# Patient Record
Sex: Female | Born: 1945
Health system: Southern US, Community
[De-identification: ages and names within clinical notes are randomized; demographics above are authoritative.]

## PROBLEM LIST (undated history)

## (undated) DIAGNOSIS — E669 Obesity, unspecified: Secondary | ICD-10-CM

## (undated) DIAGNOSIS — R0602 Shortness of breath: Principal | ICD-10-CM

## (undated) DIAGNOSIS — I2510 Atherosclerotic heart disease of native coronary artery without angina pectoris: Secondary | ICD-10-CM

## (undated) DIAGNOSIS — E785 Hyperlipidemia, unspecified: Secondary | ICD-10-CM

## (undated) DIAGNOSIS — M791 Myalgia, unspecified site: Secondary | ICD-10-CM

## (undated) DIAGNOSIS — E079 Disorder of thyroid, unspecified: Secondary | ICD-10-CM

## (undated) DIAGNOSIS — N809 Endometriosis, unspecified: Secondary | ICD-10-CM

## (undated) DIAGNOSIS — K91840 Postprocedural hemorrhage and hematoma of a digestive system organ or structure following a digestive system procedure: Secondary | ICD-10-CM

## (undated) DIAGNOSIS — G459 Transient cerebral ischemic attack, unspecified: Secondary | ICD-10-CM

## (undated) HISTORY — DX: Disorder of thyroid, unspecified: E07.9

## (undated) HISTORY — DX: Shortness of breath: R06.02

## (undated) HISTORY — DX: Postprocedural hemorrhage of a digestive system organ or structure following a digestive system procedure: K91.840

## (undated) HISTORY — DX: Obesity, unspecified: E66.9

## (undated) HISTORY — DX: Transient cerebral ischemic attack, unspecified: G45.9

## (undated) HISTORY — PX: CARDIAC CATHETERIZATION: SHX172

## (undated) HISTORY — DX: Hyperlipidemia, unspecified: E78.5

## (undated) HISTORY — DX: Myalgia, unspecified site: M79.10

## (undated) HISTORY — DX: Endometriosis, unspecified: N80.9

---

## 2000-07-12 ENCOUNTER — Other Ambulatory Visit: Admission: RE | Admit: 2000-07-12 | Discharge: 2000-07-12 | Payer: Self-pay | Admitting: Internal Medicine

## 2000-07-20 ENCOUNTER — Encounter: Admission: RE | Admit: 2000-07-20 | Discharge: 2000-10-18 | Payer: Self-pay | Admitting: Internal Medicine

## 2001-07-14 ENCOUNTER — Other Ambulatory Visit: Admission: RE | Admit: 2001-07-14 | Discharge: 2001-07-14 | Payer: Self-pay | Admitting: Internal Medicine

## 2002-09-27 ENCOUNTER — Encounter: Payer: Self-pay | Admitting: Ophthalmology

## 2002-09-27 ENCOUNTER — Ambulatory Visit (HOSPITAL_COMMUNITY): Admission: RE | Admit: 2002-09-27 | Discharge: 2002-09-28 | Payer: Self-pay | Admitting: Ophthalmology

## 2003-06-28 ENCOUNTER — Encounter: Payer: Self-pay | Admitting: Internal Medicine

## 2003-06-28 ENCOUNTER — Encounter: Admission: RE | Admit: 2003-06-28 | Discharge: 2003-06-28 | Payer: Self-pay | Admitting: Internal Medicine

## 2006-04-23 ENCOUNTER — Emergency Department (HOSPITAL_COMMUNITY): Admission: EM | Admit: 2006-04-23 | Discharge: 2006-04-23 | Payer: Self-pay | Admitting: Emergency Medicine

## 2008-05-07 ENCOUNTER — Ambulatory Visit (HOSPITAL_COMMUNITY): Admission: RE | Admit: 2008-05-07 | Discharge: 2008-05-07 | Payer: Self-pay | Admitting: Gastroenterology

## 2008-05-07 ENCOUNTER — Encounter (INDEPENDENT_AMBULATORY_CARE_PROVIDER_SITE_OTHER): Payer: Self-pay | Admitting: Gastroenterology

## 2008-10-06 ENCOUNTER — Ambulatory Visit: Payer: Self-pay | Admitting: Cardiology

## 2008-10-06 ENCOUNTER — Inpatient Hospital Stay (HOSPITAL_COMMUNITY): Admission: EM | Admit: 2008-10-06 | Discharge: 2008-10-08 | Payer: Self-pay | Admitting: Emergency Medicine

## 2008-10-08 ENCOUNTER — Encounter (INDEPENDENT_AMBULATORY_CARE_PROVIDER_SITE_OTHER): Payer: Self-pay | Admitting: Internal Medicine

## 2008-10-08 ENCOUNTER — Ambulatory Visit: Payer: Self-pay | Admitting: Vascular Surgery

## 2009-03-21 ENCOUNTER — Encounter: Admission: RE | Admit: 2009-03-21 | Discharge: 2009-03-21 | Payer: Self-pay | Admitting: Internal Medicine

## 2009-10-16 ENCOUNTER — Encounter: Admission: RE | Admit: 2009-10-16 | Discharge: 2009-10-16 | Payer: Self-pay | Admitting: Internal Medicine

## 2009-10-28 ENCOUNTER — Encounter: Admission: RE | Admit: 2009-10-28 | Discharge: 2009-10-28 | Payer: Self-pay | Admitting: Internal Medicine

## 2011-03-31 NOTE — H&P (Signed)
NAMELAURIANA, [name] NO.:  [id number]   MEDICAL RECORD NO.:  [id number]          PATIENT TYPE:  INP   LOCATION:  2014                         FACILITY:  [facility]   PHYSICIAN:  Joylene John, MD       DATE OF BIRTH:  1946/09/19   DATE OF ADMISSION:  10/06/2008  DATE OF DISCHARGE:                              HISTORY & PHYSICAL   REASON FOR ADMISSION:  Chest pain, which started earlier today.   HISTORY OF PRESENT ILLNESS:  This is a 65 year old white female with  history of Graves disease who came to the emergency room because she had  onset of left-sided chest pain, which started around 1 o'clock in the  afternoon today.  According to the patient, she has never had a similar  episode in the past.  She was getting dressed when the episode started.  The pain described by the patient was left sided, band like and pressure  like, going around to the back, associated with nausea.  According to  the patient, the pain was not aggravated or relieved by anything, and  the pain lasted approximately until the time she came to the ER.  The  patient denies any diaphoresis, dizziness, or lightheadedness that was  associated with the pain.  The patient does tell me that she has  returned from a long driving trip recently on Tuesday; however, she  tells me she did take several breaks.  She did not notice that there was  any lower extremity swelling after the trip.   PAST MEDICAL HISTORY:  Significant for Graves disease.   SOCIAL HISTORY:  The patient is a nonsmoker.  No drugs.  Social alcohol  drinker.   FAMILY HISTORY:  Father passed away from MI at age 28.  Sister with 6  stents, she is in her 31s.   ALLERGIES:  PENICILLIN, CODEINE, and DEMEROL.   CURRENT MEDICATIONS:  1. Aspirin 81 mg daily.  2. Synthroid 88 mcg a day.   PERTINENT LABORATORIES:  Hemoglobin of 12.7, crit 37.2, platelets 222,  and white count 5.1.  Sodium 135, potassium 3.8, chloride 102, bicarb  27, BUN  12, creatinine 0.87, glucose 89, and calcium 9.3.  First set of  cardiac enzymes is negative.  TSH is 0.422.  EKG was normal sinus rhythm  with no ST-T changes.  Chest x-ray does not show any acute pathology.   PHYSICAL EXAMINATION:  VITAL SIGNS:  Temperature of 98, pulse ranging  from 59 to 62, respirations 18, blood pressure 151/73.  GENERAL:  This is a white female who looks younger than her  chronological age.  NECK:  No JVD appreciated.  HEENT:  No icterus or pallor noted.  LUNGS:  Clear to auscultation.  CARDIOVASCULAR:  Regular rate and rhythm.  No murmurs were heard.  LOWER EXTREMITIES:  No edema is appreciated.   ASSESSMENT AND PLAN:  This is a 66 year old female coming in with new  onset of chest pain, which started this afternoon.  Plan is to admit the  patient to a telemetry bed and to do serial cardiac enzymes q.8  h., we  will also get an echocardiogram in the morning.  The patient can likely  be scheduled to have an outpatient stress test if her workup is negative  in the hospital and she remains hemodynamically stable.  We will  continue her aspirin and Synthroid.  We will do also start her on a low-  dose beta-blocker withhold parameters since the patient's heart rate is  in the 60s.  We will add a TSH level to make sure that she is not  hyperthyroid or overtly medicated.  We will also get a D-dimer and lower  extremity ultrasound to rule out DVT since the patient has history of  recent long driving trip.  If either of them are positive, then to  consider a CT of the chest to rule out PE since this could have caused  the chest pain episode that the patient describes as well.      Joylene John, MD  Electronically Signed     RP/MEDQ  D:  10/06/2008  T:  10/07/2008  Job:  [id number]

## 2011-03-31 NOTE — Op Note (Signed)
NAME:  [name], [name] NO.:  [id number]   MEDICAL RECORD NO.:  [id number]          PATIENT TYPE:  AMB   LOCATION:  ENDO                         FACILITY:  [facility]   PHYSICIAN:  Anselmo Rod, M.D.  DATE OF BIRTH:  October 18, 1946   DATE OF PROCEDURE:  05/07/2008  DATE OF DISCHARGE:                               OPERATIVE REPORT   PROCEDURE PERFORMED:  Colonoscopy with cold biopsies x 2.   ENDOSCOPIST:  Anselmo Rod, M.D.   INSTRUMENT USED:  Pentax video colonoscope.   INDICATIONS FOR PROCEDURE:  A 65 year old white female undergoing a  screening colonoscopy to rule out colonic polyps, masses, etc.   PREPROCEDURE PREPARATION:  Informed consent was procured from the  patient.  The patient fasted for the 4 hours prior to the procedure and  prepped with 20 OsmoPrep over night and 12 OsmoPrep the morning of  procedure.   The risks and benefits of the procedure, including a 10% missed rate of  cancer and polyp were discussed with the patient as well.   PREPROCEDURE PHYSICAL:  VITAL SIGNS:  The patient had stable vital  signs.  NECK:  Supple.  CHEST:  Clear to auscultation.  HEART:  S1-S2 regular.  ABDOMEN:  Soft with normal bowel sounds.   DESCRIPTION OF PROCEDURE:  The patient was placed in left lateral  decubitus position and sedated with 100 mcg of Fentanyl and 10 mg of  Versed given intravenously in slow incremental doses. Once the patient  was adequately sedated and maintained on low-flow oxygen and continuous  cardiac monitoring, the Pentax video colonoscope was advanced from the  rectum to the cecum with difficulty.  The patient had a very tortuous  colon.  The patient's position was changed from the left lateral to the  supine and the right lateral position back to the supine and the left  lateral position.  A small sessile polyp was removed from the hepatic  flexure (cold biopsies x2).  The rest of the colonic mucosa appeared  healthy with a normal  vascular pattern.  The appendiceal orifice and  ileocecal valve were clearly visualized and photographed.  The terminal  ileum appeared healthy without lesions.  Retroflexion revealed small  internal hemorrhoids.  Small external hemorrhoids were also noted on  anal inspection.  No other masses or polyps were identified.  There was  no evidence of diverticulosis.  No AVMs were present.  The patient  tolerated the procedure well without immediate complications.   IMPRESSION:  1. Small external and internal hemorrhoids seen on anal inspection and      retroflexion respectively.  2. Small sessile polyp removed by cold biopsies x2 from the hepatic      flexure.  3. Otherwise normal exam up to the terminal ileum.   RECOMMENDATIONS:  1. Await pathology results.  2. Continue high fiber diet with liberal fluid intake.  3. Outpatient follow-up as the need arises in the future.      Anselmo Rod, M.D.  Electronically Signed     JNM/MEDQ  D:  05/07/2008  T:  05/07/2008  Job:  [id number]   cc:   Candyce Churn. Allyne Gee, M.D.  Fax: [phone]

## 2011-03-31 NOTE — Discharge Summary (Signed)
[name], [name] NO.:  [id number]   MEDICAL RECORD NO.:  [id number]          PATIENT TYPE:  INP   LOCATION:  2014                         FACILITY:  [facility]   PHYSICIAN:  Theodosia Paling, MD    DATE OF BIRTH:  [birth year]   DATE OF ADMISSION:  10/06/2008  DATE OF DISCHARGE:  10/08/2008                               DISCHARGE SUMMARY   PRIMARY CARE PHYSICIAN:  Unassigned.   ADMITTING HISTORY:  Please refer to the admission note dictated by Dr.  [name] under history of present illness.   DISCHARGE DIAGNOSES:  1. Chest pain, most likely non-cardiac in nature.  2. Hypothyroidism.   DISCHARGE MEDICATIONS:  1. Aspirin 81 mg p.o. daily, enteric-coated.  2. Synthroid 88 mcg p.o. daily.   HOSPITAL COURSE:  Following issues were addressed during the  hospitalization:  1. Chest pain.  The patient underwent tele evaluation.  Her EKG was      negative.  Her cardiac enzymes were negative.  She did not have      again another episode of chest pain.  She was on aspirin and low-      dose beta-blocker.  She underwent a stress test next day and      echocardiogram which both of them were negative for any wall motion      or any signs of ischemia or wall motion defect respectively.  The      patient, at the time of discharge, was asymptomatic and      hemodynamically stable.  She will be going home and she was      instructed to continue low-dose aspirin, enteric-coated p.o. daily.  2. Hypothyroidism.  Synthroid 88 mcg p.o. daily was continued.   PROCEDURE PERFORMED:  Treadmill stress test.   CONSULTATION PERFORMED:  None.   IMAGING PERFORMED:  Echocardiogram showed normal LV size and function  with ejection fraction of 60% with subaortic ridge or membrane with some  subaortic turbulence but no significant stenosis.  CT scan, CT angio of  the chest done on October 07, 2008, showed it was negative for any PE.  A treadmill stress test done on October 08, 2008,  was negative for any  ischemic evidence on EKG or tele or reproduction of symptom.   DISPOSITION:  The patient will follow up with her primary care physician  in 1 week's time for control of her cholesterol.   DISCHARGE INSTRUCTIONS:  The patient is instructed to pursue healthy  diet and exercise regimen given her allergic reaction to statin.   TOTAL TIME SPENT IN DISCHARGE OF THIS PATIENT:  40 minutes.      Theodosia Paling, MD  Electronically Signed     NP/MEDQ  D:  10/08/2008  T:  10/09/2008  Job:  [id number]

## 2011-04-03 NOTE — Op Note (Signed)
NAME:  [name], [name]                         ACCOUNT NO.:  [id number]   MEDICAL RECORD NO.:  [id number]                   PATIENT TYPE:  OIB   LOCATION:  5729                                 FACILITY:  [facility]   PHYSICIAN:  Beulah Gandy. Ashley Royalty, M.D.              DATE OF BIRTH:  14-May-1946   DATE OF PROCEDURE:  09/28/2002  DATE OF DISCHARGE:                                 OPERATIVE REPORT   PREOPERATIVE DIAGNOSES:  1. Retinal breaks, right eye.  2. Rhegmatogenous retinal detachment, left eye.   PROCEDURES:  1. Laser for retinal breaks, right eye.  2. Scleral buckle with retinal photocoagulation, left eye.   SURGEON:  Beulah Gandy. Ashley Royalty, M.D.   ASSISTANT:  Lu Duffel, C.O.A., S.A.   ANESTHESIA:  General.   COMPLICATIONS:  None.   DURATION:  Two hours.   DESCRIPTION OF PROCEDURE:  Usual prep and drape.  After proper endotracheal  anesthesia, attention was carried to the right eye, where retinal breaks  were seen at 9 o'clock and 10 o'clock.  These were surrounded with indirect  ophthalmoscope laser, 402 burns.  The power was 400 milliwatts, 1000 microns  each, and 0.1 seconds each.  Ointment was placed in the eye, and the eye was  taped shut and covered with a shield.  Attention was then carried to the  left eye, where the usual prep and drape was performed for ophthalmic  surgery.  A 360 degree limbal peritomy, isolation of four rectus muscles on  2-0 silk, localization of break at 3 o'clock.  Scleral dissection from 12  o'clock to 6 o'clock to admit a #279 intrascleral implant.  Additional  posterior dissection was carried out at 3 o'clock to support a small 508G  wedge segment.  The diathermy was placed in the bed.  Two scleral flaps were  placed in each quadrant for a total of four scleral sutures.  The 240 band  was placed around the eye with a belt loop at 8 o'clock and 10 o'clock and a  270 sleeve at 10 o'clock.  The 279 implant was placed against the globe  except for  the 508G wedge at 3 o'clock.  A perforation site was chosen at 4  o'clock.  A large amount of clear, colorless subretinal fluid came forth.  A  second drainage site was performed at 3 o'clock in the posterior aspect of  the bed.  A moderate amount of clear, colorless subretinal fluid came forth  at this point.  The scleral flaps were closed.  Indirect ophthalmoscopy  showed the retina to be lying nicely on the scleral buckle with the retinal  break well-supported.  The indirect ophthalmoscope laser was moved into  place.  Three hundred seventy-seven burns were placed on the scleral buckle  around the retinal break.  Power was 400 milliwatts, 1000 microns each, and  0.1 second each.  The retina was lying nicely on  the scleral buckle with  minimal subretinal fluid remaining.  The scleral flaps were adjusted and  trimmed.  The scleral sutures were knotted, adjusted, and trimmed.  The  scleral buckle was adjusted and trimmed.  The 240 band was adjusted and  trimmed.  The conjunctiva was reposited with 7-0 chromic suture.  Polymyxin  and gentamicin were irrigated into Tenon's space.  Atropine solution was  applied.  The closing tension was 10 with a  Barraquer tonometer.  Decadron 10 mg was injected into the lower  subconjunctival space.  Marcaine was injected around the globe for postop  pain.  Polysporin, a patch and shield were placed.  The patient was awakened  and taken to recovery in satisfactory condition.                                                Beulah Gandy. Ashley Royalty, M.D.    JDM/MEDQ  D:  09/27/2002  T:  09/28/2002  Job:  [id number]

## 2011-07-24 ENCOUNTER — Other Ambulatory Visit: Payer: Self-pay | Admitting: Internal Medicine

## 2011-07-24 DIAGNOSIS — R1013 Epigastric pain: Secondary | ICD-10-CM

## 2011-07-24 DIAGNOSIS — R5381 Other malaise: Secondary | ICD-10-CM

## 2011-07-27 ENCOUNTER — Ambulatory Visit
Admission: RE | Admit: 2011-07-27 | Discharge: 2011-07-27 | Disposition: A | Discharge status: Home / Self Care | Payer: Medicare Other | Source: Ambulatory Visit | Attending: Internal Medicine | Admitting: Internal Medicine

## 2011-07-27 DIAGNOSIS — R5381 Other malaise: Secondary | ICD-10-CM

## 2011-07-27 DIAGNOSIS — R1013 Epigastric pain: Secondary | ICD-10-CM

## 2011-08-10 ENCOUNTER — Encounter (INDEPENDENT_AMBULATORY_CARE_PROVIDER_SITE_OTHER): Payer: Medicare Other | Admitting: Ophthalmology

## 2011-08-10 DIAGNOSIS — H35379 Puckering of macula, unspecified eye: Secondary | ICD-10-CM

## 2011-08-18 LAB — CBC
HCT: 35 — ABNORMAL LOW
HCT: 35.7 — ABNORMAL LOW
HCT: 37.2
Hemoglobin: 11.9 — ABNORMAL LOW
MCHC: 33.7
MCHC: 34.2
MCHC: 34.2
MCV: 92.2
Platelets: 199
Platelets: 222
RBC: 3.87
RDW: 13
RDW: 13.3
RDW: 13.5
WBC: 5
WBC: 5.1

## 2011-08-18 LAB — BASIC METABOLIC PANEL
BUN: 12
Calcium: 9.5
Chloride: 102
GFR calc Af Amer: >60
GFR calc non Af Amer: 56 — ABNORMAL LOW
GFR calc non Af Amer: >60
Glucose, Bld: 107 — ABNORMAL HIGH
Glucose, Bld: 89

## 2011-08-18 LAB — CARDIAC PANEL(CRET KIN+CKTOT+MB+TROPI)
CK, MB: 0.5
Total CK: 27
Troponin I: 0.01

## 2011-08-18 LAB — COMPREHENSIVE METABOLIC PANEL
Alkaline Phosphatase: 86
Calcium: 9.4
Chloride: 105
Creatinine, Ser: 1
GFR calc Af Amer: >60
GFR calc non Af Amer: 56 — ABNORMAL LOW
Glucose, Bld: 104 — ABNORMAL HIGH
Potassium: 3.6
Sodium: 141
Total Bilirubin: 0.6

## 2011-08-18 LAB — CK TOTAL AND CKMB (NOT AT ARMC)
CK, MB: 0.5
Relative Index: INVALID

## 2011-08-18 LAB — POCT CARDIAC MARKERS
CKMB, poc: <1 — ABNORMAL LOW
Myoglobin, poc: 76.2
Troponin i, poc: <0.05

## 2011-08-18 LAB — LIPID PANEL
Cholesterol: 302 — ABNORMAL HIGH
HDL: 54
Total CHOL/HDL Ratio: 5.6
VLDL: 32

## 2011-08-18 LAB — DIFFERENTIAL
Basophils Relative: 1
Lymphocytes Relative: 32
Monocytes Absolute: 0.3
Neutro Abs: 3

## 2011-08-18 LAB — TROPONIN I: Troponin I: <0.01

## 2012-02-26 DIAGNOSIS — M7730 Calcaneal spur, unspecified foot: Secondary | ICD-10-CM | POA: Diagnosis not present

## 2012-02-26 DIAGNOSIS — L600 Ingrowing nail: Secondary | ICD-10-CM | POA: Diagnosis not present

## 2012-08-10 ENCOUNTER — Encounter (INDEPENDENT_AMBULATORY_CARE_PROVIDER_SITE_OTHER): Payer: Medicare Other | Admitting: Ophthalmology

## 2012-08-25 DIAGNOSIS — M25579 Pain in unspecified ankle and joints of unspecified foot: Secondary | ICD-10-CM | POA: Diagnosis not present

## 2012-09-01 DIAGNOSIS — M25579 Pain in unspecified ankle and joints of unspecified foot: Secondary | ICD-10-CM | POA: Diagnosis not present

## 2012-09-13 DIAGNOSIS — M25579 Pain in unspecified ankle and joints of unspecified foot: Secondary | ICD-10-CM | POA: Diagnosis not present

## 2012-09-17 DIAGNOSIS — Z23 Encounter for immunization: Secondary | ICD-10-CM | POA: Diagnosis not present

## 2012-09-19 DIAGNOSIS — H9110 Presbycusis, unspecified ear: Secondary | ICD-10-CM | POA: Diagnosis not present

## 2012-09-19 DIAGNOSIS — H9319 Tinnitus, unspecified ear: Secondary | ICD-10-CM | POA: Diagnosis not present

## 2012-10-17 DIAGNOSIS — M545 Low back pain: Secondary | ICD-10-CM | POA: Diagnosis not present

## 2012-10-17 DIAGNOSIS — M4120 Other idiopathic scoliosis, site unspecified: Secondary | ICD-10-CM | POA: Diagnosis not present

## 2012-10-31 DIAGNOSIS — M545 Low back pain: Secondary | ICD-10-CM | POA: Diagnosis not present

## 2013-02-10 DIAGNOSIS — E039 Hypothyroidism, unspecified: Secondary | ICD-10-CM | POA: Diagnosis not present

## 2013-02-10 DIAGNOSIS — R011 Cardiac murmur, unspecified: Secondary | ICD-10-CM | POA: Diagnosis not present

## 2013-02-10 DIAGNOSIS — E785 Hyperlipidemia, unspecified: Secondary | ICD-10-CM | POA: Diagnosis not present

## 2013-02-10 DIAGNOSIS — Z0000 Encounter for general adult medical examination without abnormal findings: Secondary | ICD-10-CM | POA: Diagnosis not present

## 2013-02-10 DIAGNOSIS — Z79899 Other long term (current) drug therapy: Secondary | ICD-10-CM | POA: Diagnosis not present

## 2013-02-10 DIAGNOSIS — F329 Major depressive disorder, single episode, unspecified: Secondary | ICD-10-CM | POA: Diagnosis not present

## 2013-03-02 DIAGNOSIS — R011 Cardiac murmur, unspecified: Secondary | ICD-10-CM | POA: Diagnosis not present

## 2013-07-28 DIAGNOSIS — R209 Unspecified disturbances of skin sensation: Secondary | ICD-10-CM | POA: Diagnosis not present

## 2013-07-28 DIAGNOSIS — Z1239 Encounter for other screening for malignant neoplasm of breast: Secondary | ICD-10-CM | POA: Diagnosis not present

## 2013-07-28 DIAGNOSIS — E785 Hyperlipidemia, unspecified: Secondary | ICD-10-CM | POA: Diagnosis not present

## 2013-07-28 DIAGNOSIS — M2550 Pain in unspecified joint: Secondary | ICD-10-CM | POA: Diagnosis not present

## 2013-07-28 DIAGNOSIS — E039 Hypothyroidism, unspecified: Secondary | ICD-10-CM | POA: Diagnosis not present

## 2013-07-28 DIAGNOSIS — Z1331 Encounter for screening for depression: Secondary | ICD-10-CM | POA: Diagnosis not present

## 2013-09-20 DIAGNOSIS — Z23 Encounter for immunization: Secondary | ICD-10-CM | POA: Diagnosis not present

## 2014-03-07 DIAGNOSIS — N760 Acute vaginitis: Secondary | ICD-10-CM | POA: Diagnosis not present

## 2014-03-07 DIAGNOSIS — R350 Frequency of micturition: Secondary | ICD-10-CM | POA: Diagnosis not present

## 2014-04-25 DIAGNOSIS — M5106 Intervertebral disc disorders with myelopathy, lumbar region: Secondary | ICD-10-CM | POA: Diagnosis not present

## 2014-04-25 DIAGNOSIS — M999 Biomechanical lesion, unspecified: Secondary | ICD-10-CM | POA: Diagnosis not present

## 2014-04-25 DIAGNOSIS — M5430 Sciatica, unspecified side: Secondary | ICD-10-CM | POA: Diagnosis not present

## 2014-04-25 DIAGNOSIS — M5137 Other intervertebral disc degeneration, lumbosacral region: Secondary | ICD-10-CM | POA: Diagnosis not present

## 2014-04-27 DIAGNOSIS — M5137 Other intervertebral disc degeneration, lumbosacral region: Secondary | ICD-10-CM | POA: Diagnosis not present

## 2014-04-27 DIAGNOSIS — M5106 Intervertebral disc disorders with myelopathy, lumbar region: Secondary | ICD-10-CM | POA: Diagnosis not present

## 2014-04-27 DIAGNOSIS — M5430 Sciatica, unspecified side: Secondary | ICD-10-CM | POA: Diagnosis not present

## 2014-04-27 DIAGNOSIS — M999 Biomechanical lesion, unspecified: Secondary | ICD-10-CM | POA: Diagnosis not present

## 2014-04-28 DIAGNOSIS — M5106 Intervertebral disc disorders with myelopathy, lumbar region: Secondary | ICD-10-CM | POA: Diagnosis not present

## 2014-04-28 DIAGNOSIS — M5430 Sciatica, unspecified side: Secondary | ICD-10-CM | POA: Diagnosis not present

## 2014-04-28 DIAGNOSIS — M5137 Other intervertebral disc degeneration, lumbosacral region: Secondary | ICD-10-CM | POA: Diagnosis not present

## 2014-04-28 DIAGNOSIS — M999 Biomechanical lesion, unspecified: Secondary | ICD-10-CM | POA: Diagnosis not present

## 2014-04-30 DIAGNOSIS — M5137 Other intervertebral disc degeneration, lumbosacral region: Secondary | ICD-10-CM | POA: Diagnosis not present

## 2014-04-30 DIAGNOSIS — M5106 Intervertebral disc disorders with myelopathy, lumbar region: Secondary | ICD-10-CM | POA: Diagnosis not present

## 2014-04-30 DIAGNOSIS — M5430 Sciatica, unspecified side: Secondary | ICD-10-CM | POA: Diagnosis not present

## 2014-04-30 DIAGNOSIS — M999 Biomechanical lesion, unspecified: Secondary | ICD-10-CM | POA: Diagnosis not present

## 2014-06-06 DIAGNOSIS — M5106 Intervertebral disc disorders with myelopathy, lumbar region: Secondary | ICD-10-CM | POA: Diagnosis not present

## 2014-06-06 DIAGNOSIS — M5430 Sciatica, unspecified side: Secondary | ICD-10-CM | POA: Diagnosis not present

## 2014-06-06 DIAGNOSIS — M5137 Other intervertebral disc degeneration, lumbosacral region: Secondary | ICD-10-CM | POA: Diagnosis not present

## 2014-06-06 DIAGNOSIS — M999 Biomechanical lesion, unspecified: Secondary | ICD-10-CM | POA: Diagnosis not present

## 2014-06-07 DIAGNOSIS — M5106 Intervertebral disc disorders with myelopathy, lumbar region: Secondary | ICD-10-CM | POA: Diagnosis not present

## 2014-06-07 DIAGNOSIS — M999 Biomechanical lesion, unspecified: Secondary | ICD-10-CM | POA: Diagnosis not present

## 2014-06-07 DIAGNOSIS — M5430 Sciatica, unspecified side: Secondary | ICD-10-CM | POA: Diagnosis not present

## 2014-06-07 DIAGNOSIS — M5137 Other intervertebral disc degeneration, lumbosacral region: Secondary | ICD-10-CM | POA: Diagnosis not present

## 2014-06-08 DIAGNOSIS — M5430 Sciatica, unspecified side: Secondary | ICD-10-CM | POA: Diagnosis not present

## 2014-06-08 DIAGNOSIS — M5106 Intervertebral disc disorders with myelopathy, lumbar region: Secondary | ICD-10-CM | POA: Diagnosis not present

## 2014-06-08 DIAGNOSIS — M5137 Other intervertebral disc degeneration, lumbosacral region: Secondary | ICD-10-CM | POA: Diagnosis not present

## 2014-06-08 DIAGNOSIS — M999 Biomechanical lesion, unspecified: Secondary | ICD-10-CM | POA: Diagnosis not present

## 2014-06-09 DIAGNOSIS — M5106 Intervertebral disc disorders with myelopathy, lumbar region: Secondary | ICD-10-CM | POA: Diagnosis not present

## 2014-06-09 DIAGNOSIS — M999 Biomechanical lesion, unspecified: Secondary | ICD-10-CM | POA: Diagnosis not present

## 2014-06-09 DIAGNOSIS — M5137 Other intervertebral disc degeneration, lumbosacral region: Secondary | ICD-10-CM | POA: Diagnosis not present

## 2014-06-09 DIAGNOSIS — M5430 Sciatica, unspecified side: Secondary | ICD-10-CM | POA: Diagnosis not present

## 2014-07-27 ENCOUNTER — Encounter: Payer: Self-pay | Admitting: *Deleted

## 2014-10-18 ENCOUNTER — Other Ambulatory Visit (HOSPITAL_COMMUNITY): Payer: Self-pay | Admitting: Internal Medicine

## 2014-10-18 DIAGNOSIS — Z1231 Encounter for screening mammogram for malignant neoplasm of breast: Secondary | ICD-10-CM

## 2014-10-24 ENCOUNTER — Other Ambulatory Visit (HOSPITAL_COMMUNITY): Payer: Self-pay | Admitting: Internal Medicine

## 2014-10-24 ENCOUNTER — Ambulatory Visit (HOSPITAL_COMMUNITY)
Admission: RE | Admit: 2014-10-24 | Discharge: 2014-10-24 | Disposition: A | Discharge status: Home / Self Care | Payer: Medicare Other | Source: Ambulatory Visit | Attending: Internal Medicine | Admitting: Internal Medicine

## 2014-10-24 DIAGNOSIS — Z1231 Encounter for screening mammogram for malignant neoplasm of breast: Secondary | ICD-10-CM | POA: Diagnosis not present

## 2014-11-27 DIAGNOSIS — J069 Acute upper respiratory infection, unspecified: Secondary | ICD-10-CM | POA: Diagnosis not present

## 2014-11-27 DIAGNOSIS — J020 Streptococcal pharyngitis: Secondary | ICD-10-CM | POA: Diagnosis not present

## 2015-01-01 DIAGNOSIS — M545 Low back pain: Secondary | ICD-10-CM | POA: Diagnosis not present

## 2015-01-01 DIAGNOSIS — M4727 Other spondylosis with radiculopathy, lumbosacral region: Secondary | ICD-10-CM | POA: Diagnosis not present

## 2015-01-08 DIAGNOSIS — M4302 Spondylolysis, cervical region: Secondary | ICD-10-CM | POA: Diagnosis not present

## 2015-03-05 DIAGNOSIS — E785 Hyperlipidemia, unspecified: Secondary | ICD-10-CM | POA: Diagnosis not present

## 2015-03-05 DIAGNOSIS — M545 Low back pain: Secondary | ICD-10-CM | POA: Diagnosis not present

## 2015-03-05 DIAGNOSIS — R350 Frequency of micturition: Secondary | ICD-10-CM | POA: Diagnosis not present

## 2015-03-05 DIAGNOSIS — Z1389 Encounter for screening for other disorder: Secondary | ICD-10-CM | POA: Diagnosis not present

## 2015-03-05 DIAGNOSIS — E039 Hypothyroidism, unspecified: Secondary | ICD-10-CM | POA: Diagnosis not present

## 2015-03-14 DIAGNOSIS — M419 Scoliosis, unspecified: Secondary | ICD-10-CM | POA: Diagnosis not present

## 2015-03-14 DIAGNOSIS — M5416 Radiculopathy, lumbar region: Secondary | ICD-10-CM | POA: Diagnosis not present

## 2015-03-20 ENCOUNTER — Other Ambulatory Visit: Payer: Self-pay | Admitting: Physical Medicine and Rehabilitation

## 2015-03-20 DIAGNOSIS — M545 Low back pain: Secondary | ICD-10-CM

## 2015-03-25 ENCOUNTER — Other Ambulatory Visit: Payer: Self-pay | Admitting: Physical Medicine and Rehabilitation

## 2015-03-25 DIAGNOSIS — Z139 Encounter for screening, unspecified: Secondary | ICD-10-CM

## 2015-03-29 ENCOUNTER — Ambulatory Visit
Admission: RE | Admit: 2015-03-29 | Discharge: 2015-03-29 | Disposition: A | Discharge status: Home / Self Care | Payer: Medicare Other | Source: Ambulatory Visit | Attending: Physical Medicine and Rehabilitation | Admitting: Physical Medicine and Rehabilitation

## 2015-03-29 DIAGNOSIS — Z135 Encounter for screening for eye and ear disorders: Secondary | ICD-10-CM | POA: Diagnosis not present

## 2015-03-29 DIAGNOSIS — Z139 Encounter for screening, unspecified: Secondary | ICD-10-CM

## 2015-04-09 ENCOUNTER — Other Ambulatory Visit: Payer: Medicare Other

## 2015-04-09 ENCOUNTER — Ambulatory Visit
Admission: RE | Admit: 2015-04-09 | Discharge: 2015-04-09 | Disposition: A | Discharge status: Home / Self Care | Payer: Medicare Other | Source: Ambulatory Visit | Attending: Physical Medicine and Rehabilitation | Admitting: Physical Medicine and Rehabilitation

## 2015-04-09 DIAGNOSIS — M47816 Spondylosis without myelopathy or radiculopathy, lumbar region: Secondary | ICD-10-CM | POA: Diagnosis not present

## 2015-04-09 DIAGNOSIS — M545 Low back pain: Secondary | ICD-10-CM

## 2015-04-09 DIAGNOSIS — M4806 Spinal stenosis, lumbar region: Secondary | ICD-10-CM | POA: Diagnosis not present

## 2015-04-18 DIAGNOSIS — M5416 Radiculopathy, lumbar region: Secondary | ICD-10-CM | POA: Diagnosis not present

## 2015-04-18 DIAGNOSIS — M545 Low back pain: Secondary | ICD-10-CM | POA: Diagnosis not present

## 2015-04-18 DIAGNOSIS — M4806 Spinal stenosis, lumbar region: Secondary | ICD-10-CM | POA: Diagnosis not present

## 2015-05-01 DIAGNOSIS — E559 Vitamin D deficiency, unspecified: Secondary | ICD-10-CM | POA: Diagnosis not present

## 2015-05-01 DIAGNOSIS — M5137 Other intervertebral disc degeneration, lumbosacral region: Secondary | ICD-10-CM | POA: Diagnosis not present

## 2015-05-01 DIAGNOSIS — M546 Pain in thoracic spine: Secondary | ICD-10-CM | POA: Diagnosis not present

## 2015-05-01 DIAGNOSIS — M542 Cervicalgia: Secondary | ICD-10-CM | POA: Diagnosis not present

## 2015-05-01 DIAGNOSIS — M2550 Pain in unspecified joint: Secondary | ICD-10-CM | POA: Diagnosis not present

## 2015-05-01 DIAGNOSIS — M25571 Pain in right ankle and joints of right foot: Secondary | ICD-10-CM | POA: Diagnosis not present

## 2015-05-01 DIAGNOSIS — M25572 Pain in left ankle and joints of left foot: Secondary | ICD-10-CM | POA: Diagnosis not present

## 2015-05-01 DIAGNOSIS — Z79899 Other long term (current) drug therapy: Secondary | ICD-10-CM | POA: Diagnosis not present

## 2015-08-17 ENCOUNTER — Emergency Department (EMERGENCY_DEPARTMENT_HOSPITAL): Admit: 2015-08-17 | Discharge: 2015-08-17 | Disposition: A | Discharge status: Home / Self Care | Payer: Medicare Other

## 2015-08-17 ENCOUNTER — Encounter (HOSPITAL_COMMUNITY): Payer: Self-pay | Admitting: Emergency Medicine

## 2015-08-17 ENCOUNTER — Emergency Department (HOSPITAL_COMMUNITY)
Admission: EM | Admit: 2015-08-17 | Discharge: 2015-08-17 | Disposition: A | Discharge status: Home / Self Care | Payer: Medicare Other | Attending: Emergency Medicine | Admitting: Emergency Medicine

## 2015-08-17 ENCOUNTER — Emergency Department (HOSPITAL_COMMUNITY): Payer: Medicare Other

## 2015-08-17 DIAGNOSIS — Y9301 Activity, walking, marching and hiking: Secondary | ICD-10-CM | POA: Diagnosis not present

## 2015-08-17 DIAGNOSIS — X58XXXA Exposure to other specified factors, initial encounter: Secondary | ICD-10-CM | POA: Insufficient documentation

## 2015-08-17 DIAGNOSIS — Z79899 Other long term (current) drug therapy: Secondary | ICD-10-CM | POA: Diagnosis not present

## 2015-08-17 DIAGNOSIS — Z8742 Personal history of other diseases of the female genital tract: Secondary | ICD-10-CM | POA: Insufficient documentation

## 2015-08-17 DIAGNOSIS — M79605 Pain in left leg: Secondary | ICD-10-CM | POA: Diagnosis not present

## 2015-08-17 DIAGNOSIS — Y9289 Other specified places as the place of occurrence of the external cause: Secondary | ICD-10-CM | POA: Diagnosis not present

## 2015-08-17 DIAGNOSIS — E785 Hyperlipidemia, unspecified: Secondary | ICD-10-CM | POA: Diagnosis not present

## 2015-08-17 DIAGNOSIS — Y998 Other external cause status: Secondary | ICD-10-CM | POA: Diagnosis not present

## 2015-08-17 DIAGNOSIS — Z87891 Personal history of nicotine dependence: Secondary | ICD-10-CM | POA: Insufficient documentation

## 2015-08-17 DIAGNOSIS — M79609 Pain in unspecified limb: Secondary | ICD-10-CM

## 2015-08-17 DIAGNOSIS — S8992XA Unspecified injury of left lower leg, initial encounter: Secondary | ICD-10-CM | POA: Insufficient documentation

## 2015-08-17 MED ORDER — KETOROLAC TROMETHAMINE 60 MG/2ML IM SOLN
30.0000 mg | Freq: Once | INTRAMUSCULAR | Status: AC
  Administered 2015-08-17: 30 mg via INTRAMUSCULAR
  Filled 2015-08-17: qty 2

## 2015-08-17 MED ORDER — MELOXICAM 7.5 MG PO TABS: 7.5000 mg | ORAL_TABLET | Freq: Every day | ORAL | Status: DC

## 2015-08-17 MED ORDER — ACETAMINOPHEN 500 MG PO TABS
1000.0000 mg | ORAL_TABLET | Freq: Once | ORAL | Status: DC
Start: 2015-08-17 — End: 2015-08-17

## 2015-08-17 NOTE — Progress Notes (Signed)
VASCULAR LAB PRELIMINARY  PRELIMINARY  PRELIMINARY  PRELIMINARY  Left lower extremity venous duplex completed.    Preliminary report:  There is no DVT, SVT, or Baker's cyst in the left lower extremity.   Needham Biggins, RVT 08/17/2015, 3:39 PM

## 2015-08-17 NOTE — ED Provider Notes (Signed)
CSN: [id number]     Arrival date & time 08/17/15  1349 History   First MD Initiated Contact with Patient 08/17/15 1355     Chief Complaint  Patient presents with  . Extremity Pain     (Consider location/radiation/quality/duration/timing/severity/associated sxs/prior Treatment) The history is provided by the patient and the spouse.   Pt has had pain in the posterior left leg for the past 2-3 weeks with intermittent swelling.  The pain is pulling and aching.  She has been using the leg gingerly but today was walking down the stairs and felt a pop in her left posterior thigh, has had increased pain since then.  Denies fevers, CP, SOB, weakness or numbness of the leg.  She has hx spinal stenosis that is unchanged.  Has had several long car trips recently to and from [place], the last was 1 month ago.  Has also been walking up and down a lot of stairs cleaning out her brother's house in [place].    Past Medical History  Diagnosis Date  . Hyperlipidemia   . Colonoscopy causing post-procedural bleeding   . Endometriosis    Past Surgical History  Procedure Laterality Date  . Cardiac catheterization     Family History  Problem Relation Age of Onset  . Family history unknown: Yes   Social History  Substance Use Topics  . Smoking status: Former Smoker -- 1.00 packs/day  . Smokeless tobacco: None  . Alcohol Use: Yes   OB History    No data available     Review of Systems  Constitutional: Negative for fever and chills.  Respiratory: Negative for shortness of breath.   Cardiovascular: Negative for chest pain.  Allergic/Immunologic: Negative for immunocompromised state.  All other systems reviewed and are negative.     Allergies  Codeine; Demerol; and Penicillins  Home Medications   Prior to Admission medications   Medication Sig Start Date End Date Taking? Authorizing Provider  Biotin 1 MG CAPS Take 1 capsule by mouth daily.   Yes Historical Provider, MD   levothyroxine (SYNTHROID, LEVOTHROID) 88 MCG tablet Take 88 mcg by mouth daily before breakfast.   Yes Historical Provider, MD  naproxen sodium (ANAPROX) 220 MG tablet Take 440 mg by mouth 2 (two) times daily as needed (pain).   Yes Historical Provider, MD  sertraline (ZOLOFT) 25 MG tablet Take 25 mg by mouth daily.   Yes Historical Provider, MD   BP 154/86 mmHg  Pulse 64  Temp(Src) 98.2 F (36.8 C) (Oral)  Resp 14  SpO2 96% Physical Exam  Constitutional: She appears well-developed and well-nourished. No distress.  HENT:  Head: Normocephalic and atraumatic.  Neck: Neck supple.  Cardiovascular: Normal rate, regular rhythm and intact distal pulses.   Pulmonary/Chest: Effort normal and breath sounds normal. No respiratory distress. She has no wheezes. She has no rales.  Musculoskeletal:  Diffuse tenderness throughout left posterior leg including calf, posterior knee, and posterior thigh.  No erythema.  Distal pulses intact.  Sensation intact.    Tenderness over lumbar spine.  No crepitus, stepoffs. No other bony tenderness of spine.   Neurological: She is alert.  Skin: She is not diaphoretic.  Nursing note and vitals reviewed.   ED Course  Procedures (including critical care time) Labs Review Labs Reviewed - No data to display  Imaging Review Dg Knee Complete 4 Views Left  08/17/2015   CLINICAL DATA:  Left leg pain  EXAM: LEFT KNEE - COMPLETE 4+ VIEW  COMPARISON:  None.  FINDINGS: There is no evidence of fracture, dislocation, or joint effusion. There is no evidence of arthropathy or other focal bone abnormality. Soft tissues are unremarkable.  IMPRESSION: Negative.   Electronically Signed   By: Franchot Gallo M.D.   On: 08/17/2015 15:11   I have personally reviewed and evaluated these images and lab results as part of my medical decision-making.   EKG Interpretation None       2:27 PM Discussed pt with Dr Colin Rhein who will also see the patient.    3:38 PM Duplex negative.     MDM   Final diagnoses:  Left leg pain    Afebrile, nontoxic patient with left leg pain x 2-3 weeks with pop felt in posterior thigh while walking down steps today with increased pain.  Neurovascularly intact.  Xray knee negative.  Venous duplex US negative.  Dr Colin Rhein also saw and examined patient.  Recommends knee immobilizer and orthopedic follow up.  Pt updated and agrees with plan.  D/C home with mobic.  Discussed result, findings, treatment, and follow up  with patient.  Pt given return precautions.  Pt verbalizes understanding and agrees with plan.       Clayton Bibles, PA-C 08/17/15 [place], MD 08/20/15 [phone]

## 2015-08-17 NOTE — ED Notes (Signed)
Patient complains of a sharp left leg pain to the posterior upper leg.  Patient states she was walking down steps and felt a pop, after which the pain began.

## 2015-08-17 NOTE — Discharge Instructions (Signed)
Read the information below.  Use the prescribed medication as directed.  Please discuss all new medications with your pharmacist.  You may return to the Emergency Department at any time for worsening condition or any new symptoms that concern you.   If you develop uncontrolled pain, weakness or numbness of the extremity, severe discoloration of the skin, or you are unable to walk or move your leg, return to the ER for a recheck.      Pain of Unknown Etiology (Pain Without a Known Cause) You have come to your caregiver because of pain. Pain can occur in any part of the body. Often there is not a definite cause. If your laboratory (blood or urine) work was normal and X-rays or other studies were normal, your caregiver may treat you without knowing the cause of the pain. An example of this is the headache. Most headaches are diagnosed by taking a history. This means your caregiver asks you questions about your headaches. Your caregiver determines a treatment based on your answers. Usually testing done for headaches is normal. Often testing is not done unless there is no response to medications. Regardless of where your pain is located today, you can be given medications to make you comfortable. If no physical cause of pain can be found, most cases of pain will gradually leave as suddenly as they came.  If you have a painful condition and no reason can be found for the pain, it is important that you follow up with your caregiver. If the pain becomes worse or does not go away, it may be necessary to repeat tests and look further for a possible cause.  Only take over-the-counter or prescription medicines for pain, discomfort, or fever as directed by your caregiver.  For the protection of your privacy, test results cannot be given over the phone. Make sure you receive the results of your test. Ask how these results are to be obtained if you have not been informed. It is your responsibility to obtain your test  results.  You may continue all activities unless the activities cause more pain. When the pain lessens, it is important to gradually resume normal activities. Resume activities by beginning slowly and gradually increasing the intensity and duration of the activities or exercise. During periods of severe pain, bed rest may be helpful. Lie or sit in any position that is comfortable.  Ice used for acute (sudden) conditions may be effective. Use a large plastic bag filled with ice and wrapped in a towel. This may provide pain relief.  See your caregiver for continued problems. Your caregiver can help or refer you for exercises or physical therapy if necessary. If you were given medications for your condition, do not drive, operate machinery or power tools, or sign legal documents for 24 hours. Do not drink alcohol, take sleeping pills, or take other medications that may interfere with treatment. See your caregiver immediately if you have pain that is becoming worse and not relieved by medications. Document Released: 07/28/2001 Document Revised: 08/23/2013 Document Reviewed: 11/02/2005 [facility] Patient Information 2015 [place], [place]. This information is not intended to replace advice given to you by your health care provider. Make sure you discuss any questions you have with your health care provider.  Musculoskeletal Pain Musculoskeletal pain is muscle and boney aches and pains. These pains can occur in any part of the body. Your caregiver may treat you without knowing the cause of the pain. They may treat you if blood or urine  tests, X-rays, and other tests were normal.  CAUSES There is often not a definite cause or reason for these pains. These pains may be caused by a type of germ (virus). The discomfort may also come from overuse. Overuse includes working out too hard when your body is not fit. Boney aches also come from weather changes. Bone is sensitive to atmospheric pressure changes. HOME CARE  INSTRUCTIONS   Ask when your test results will be ready. Make sure you get your test results.  Only take over-the-counter or prescription medicines for pain, discomfort, or fever as directed by your caregiver. If you were given medications for your condition, do not drive, operate machinery or power tools, or sign legal documents for 24 hours. Do not drink alcohol. Do not take sleeping pills or other medications that may interfere with treatment.  Continue all activities unless the activities cause more pain. When the pain lessens, slowly resume normal activities. Gradually increase the intensity and duration of the activities or exercise.  During periods of severe pain, bed rest may be helpful. Lay or sit in any position that is comfortable.  Putting ice on the injured area.  Put ice in a bag.  Place a towel between your skin and the bag.  Leave the ice on for 15 to 20 minutes, 3 to 4 times a day.  Follow up with your caregiver for continued problems and no reason can be found for the pain. If the pain becomes worse or does not go away, it may be necessary to repeat tests or do additional testing. Your caregiver may need to look further for a possible cause. SEEK IMMEDIATE MEDICAL CARE IF:  You have pain that is getting worse and is not relieved by medications.  You develop chest pain that is associated with shortness or breath, sweating, feeling sick to your stomach (nauseous), or throw up (vomit).  Your pain becomes localized to the abdomen.  You develop any new symptoms that seem different or that concern you. MAKE SURE YOU:   Understand these instructions.  Will watch your condition.  Will get help right away if you are not doing well or get worse. Document Released: 11/02/2005 Document Revised: 01/25/2012 Document Reviewed: 07/07/2013 [facility] Patient Information 2015 [place], [place]. This information is not intended to replace advice given to you by your health care  provider. Make sure you discuss any questions you have with your health care provider.

## 2016-01-20 DIAGNOSIS — R6889 Other general symptoms and signs: Secondary | ICD-10-CM | POA: Diagnosis not present

## 2016-02-26 DIAGNOSIS — L814 Other melanin hyperpigmentation: Secondary | ICD-10-CM | POA: Diagnosis not present

## 2016-02-26 DIAGNOSIS — L720 Epidermal cyst: Secondary | ICD-10-CM | POA: Diagnosis not present

## 2016-02-26 DIAGNOSIS — D225 Melanocytic nevi of trunk: Secondary | ICD-10-CM | POA: Diagnosis not present

## 2016-02-26 DIAGNOSIS — L821 Other seborrheic keratosis: Secondary | ICD-10-CM | POA: Diagnosis not present

## 2016-02-26 DIAGNOSIS — D2262 Melanocytic nevi of left upper limb, including shoulder: Secondary | ICD-10-CM | POA: Diagnosis not present

## 2016-06-03 DIAGNOSIS — Z119 Encounter for screening for infectious and parasitic diseases, unspecified: Secondary | ICD-10-CM | POA: Diagnosis not present

## 2016-06-03 DIAGNOSIS — E785 Hyperlipidemia, unspecified: Secondary | ICD-10-CM | POA: Diagnosis not present

## 2016-06-03 DIAGNOSIS — E559 Vitamin D deficiency, unspecified: Secondary | ICD-10-CM | POA: Diagnosis not present

## 2016-06-03 DIAGNOSIS — E039 Hypothyroidism, unspecified: Secondary | ICD-10-CM | POA: Diagnosis not present

## 2016-06-23 DIAGNOSIS — F4321 Adjustment disorder with depressed mood: Secondary | ICD-10-CM | POA: Diagnosis not present

## 2016-06-23 DIAGNOSIS — Z634 Disappearance and death of family member: Secondary | ICD-10-CM | POA: Diagnosis not present

## 2016-08-05 DIAGNOSIS — E7800 Pure hypercholesterolemia, unspecified: Secondary | ICD-10-CM | POA: Diagnosis not present

## 2016-08-05 DIAGNOSIS — F4321 Adjustment disorder with depressed mood: Secondary | ICD-10-CM | POA: Diagnosis not present

## 2016-08-05 DIAGNOSIS — Z23 Encounter for immunization: Secondary | ICD-10-CM | POA: Diagnosis not present

## 2016-08-19 ENCOUNTER — Other Ambulatory Visit: Payer: Self-pay | Admitting: Internal Medicine

## 2016-08-19 DIAGNOSIS — F4321 Adjustment disorder with depressed mood: Secondary | ICD-10-CM | POA: Diagnosis not present

## 2016-08-19 DIAGNOSIS — Z0000 Encounter for general adult medical examination without abnormal findings: Secondary | ICD-10-CM | POA: Diagnosis not present

## 2016-08-19 DIAGNOSIS — Z1231 Encounter for screening mammogram for malignant neoplasm of breast: Secondary | ICD-10-CM

## 2016-08-21 ENCOUNTER — Telehealth: Payer: Self-pay | Admitting: Cardiovascular Disease

## 2016-08-21 NOTE — Telephone Encounter (Signed)
Received notes from Dr. Emi Belfast for office visit with Dr., Oval Linsey on 08/25/2016 8:00am. Notes given to [facility] (medical records) [facility]

## 2016-08-24 NOTE — Progress Notes (Signed)
Cardiology Office Note   Date:  08/25/2016   ID:  [name], DOB [birth year], MRN [id number]  PCP:  Kelton Pillar, MD  Cardiologist:   Skeet Latch, MD   No chief complaint on file.    History of Present Illness: [name] is a 70 y.o. female with hyperlipidemia, Graves disease, and depression who presents for management of hyperlipidemia.  [name] reports several years of hyperlipidemia. She has previously tried atorvastatin and rosuvastatin but developed chest pain and shortness of breath with these medications. During that time she underwent cardiac catheterization in 2011 that reportedly showed mild CAD. She was also noted to have a murmur in 2014 and had an echo that was reportedly normal. [name] has been doing well. Her only complaint is fatigue. She falls asleep at 8 in the evening and awakened still feeling tired in the morning. Her husband notes that she does not snore and has no apneic episodes. She denies any shortness of breath, chest pain, lower extremity edema, orthopnea, or PND. She does not exercise regularly which she reports is due to laziness. She notes that she has an excellent diet.  [name] reported her history of statin intolerance to Dr. Emi Belfast and was referred to cardiology for evaluation.    [name] has several family members with CAD. Her father died of a heart attack at age 74 but she remembers him using angina tablets for years before then. Her sister has multiple stents, the first of which was implanted in her late 68s. Her mother also had coronary disease in her 52s.   Past Medical History:  Diagnosis Date  . Colonoscopy causing post-procedural bleeding   . Endometriosis   . Hyperlipidemia   . Obesity 08/25/2016    Past Surgical History:  Procedure Laterality Date  . CARDIAC CATHETERIZATION       Current Outpatient Prescriptions  Medication Sig Dispense Refill  . levothyroxine (SYNTHROID,  LEVOTHROID) 88 MCG tablet Take 88 mcg by mouth daily before breakfast.    . sertraline (ZOLOFT) 25 MG tablet Take 25 mg by mouth daily.    . pravastatin (PRAVACHOL) 10 MG tablet TAKE 1/2 TABLET BY MOUTH ON Monday, Wednesday, AND Friday ONLY OR AS DIRECTED BY LIPID CLINIC 15 tablet 5   No current facility-administered medications for this visit.     Allergies:   Codeine; Demerol [meperidine]; and Penicillins    Social History:  The patient  reports that she has quit smoking. She smoked 1.00 pack per day. She does not have any smokeless tobacco history on file. She reports that she drinks alcohol. She reports that she does not use drugs.   Family History:  The patient's  family history includes CAD in her father, mother, and sister; Dementia in her brother and mother; Kidney disease in her brother; Lymphoma in her sister.    ROS:  Please see the history of present illness.   Otherwise, review of systems are positive for tingling in arms and legs.   All other systems are reviewed and negative.    PHYSICAL EXAM: VS:  BP (!) 142/78   Pulse 60   Ht 5' (1.524 m)   Wt 175 lb 3.2 oz (79.5 kg)   BMI 34.22 kg/m  , BMI Body mass index is 34.22 kg/m. GENERAL:  Well appearing HEENT:  Pupils equal round and reactive, fundi not visualized, oral mucosa unremarkable NECK:  No jugular venous distention, waveform within normal limits, carotid upstroke brisk and  symmetric, no bruits, no thyromegaly LYMPHATICS:  No cervical adenopathy LUNGS:  Clear to auscultation bilaterally HEART:  RRR.  PMI not displaced or sustained,S1 and S2 within normal limits, no S3, no S4, no clicks, no rubs, no murmurs ABD:  Flat, positive bowel sounds normal in frequency in pitch, no bruits, no rebound, no guarding, no midline pulsatile mass, no hepatomegaly, no splenomegaly EXT:  2 plus pulses throughout, no edema, no cyanosis no clubbing SKIN:  No rashes no nodules NEURO:  Cranial nerves II through XII grossly intact,  motor grossly intact throughout PSYCH:  Cognitively intact, oriented to person place and time   EKG:  EKG is ordered today. The ekg ordered today demonstrates sinus rhythm. Rate 60 bpm. Nonspecific T wave abnormalities.   Recent Labs: No results found for requested labs within last 8760 hours.    Lipid Panel    Component Value Date/Time   CHOL (H) 10/08/2008 0500    302        ATP III CLASSIFICATION:  <200     mg/dL   Desirable  200-239  mg/dL   Borderline High  >=240    mg/dL   High   TRIG 158 (H) 10/08/2008 0500   HDL 54 10/08/2008 0500   CHOLHDL 5.6 10/08/2008 0500   VLDL 32 10/08/2008 0500   LDLCALC (H) 10/08/2008 0500    216        Total Cholesterol/HDL:CHD Risk Coronary Heart Disease Risk Table                     Men   Women  1/2 Average Risk   3.4   3.3    06/03/16:  WBC 5.2, hemoglobin of 0.4, hematocrit 37.4, platelets 222 Sodium 140, potassium 4.3 BUN 20, creatinine 0.96 AST 24, ALT 22 TSH 0.37 Total cholesterol 341, triglycerides 334, HDL 58, LDL 217  Wt Readings from Last 3 Encounters:  08/25/16 175 lb 3.2 oz (79.5 kg)      ASSESSMENT AND PLAN:  # Hyperlipidemia: Ms. Fassler has possible familial hyperlipidemia with the [place] lipid clinic score of 4 (LDL 217 and FH premature CAD).  She does not have any physical exam findings such as tendon xanthomas or arcus corenalis.  She was previously revealed rosuvastatin and atorvastatin. We will try pravastatin 5 mg 3 times per week, which is unlikely to get her to goal. Given her report of some coronary disease on cardiac catheterization she may qualify for PCSK9 inhibitor.  We will get her to check her lipids again before following up with our lipid clinic in one month. I have also encouraged her to exercise at least 30-45 minutes most days of the week.  I think this will also help with her energy levels    Current medicines are reviewed at length with the patient today.  The patient does not have concerns  regarding medicines.  The following changes have been made:  Pravastatin 5 mg MWF.  Labs/ tests ordered today include:   Orders Placed This Encounter  Procedures  . Lipid panel  . Comprehensive metabolic panel  . EKG 12-Lead     Disposition:   FU with Kabella Cassidy C. Oval Linsey, MD, [facility] in 3 months.  Lipid clinic in 1 month.     This note was written with the assistance of speech recognition software.  Please excuse any transcriptional errors.  Signed, Ashtin Melichar C. Oval Linsey, MD, [facility]  08/25/2016 8:50 AM    [facility]

## 2016-08-25 ENCOUNTER — Ambulatory Visit (INDEPENDENT_AMBULATORY_CARE_PROVIDER_SITE_OTHER): Payer: Medicare Other | Admitting: Cardiovascular Disease

## 2016-08-25 ENCOUNTER — Encounter: Payer: Self-pay | Admitting: Cardiovascular Disease

## 2016-08-25 VITALS — BP 142/78 | HR 60 | Ht 60.0 in | Wt 175.2 lb

## 2016-08-25 DIAGNOSIS — E785 Hyperlipidemia, unspecified: Secondary | ICD-10-CM | POA: Diagnosis not present

## 2016-08-25 DIAGNOSIS — E7800 Pure hypercholesterolemia, unspecified: Secondary | ICD-10-CM | POA: Diagnosis not present

## 2016-08-25 DIAGNOSIS — E66811 Obesity, class 1: Secondary | ICD-10-CM

## 2016-08-25 DIAGNOSIS — E6609 Other obesity due to excess calories: Secondary | ICD-10-CM | POA: Diagnosis not present

## 2016-08-25 DIAGNOSIS — E669 Obesity, unspecified: Secondary | ICD-10-CM

## 2016-08-25 HISTORY — DX: Obesity, unspecified: E66.9

## 2016-08-25 MED ORDER — PRAVASTATIN SODIUM 10 MG PO TABS: ORAL_TABLET | ORAL | 5 refills | Status: DC

## 2016-08-25 NOTE — Patient Instructions (Signed)
Medication Instructions:  START PRAVASTATIN 5 MG ON Monday, Wednesday, AND Friday ONLY  Labwork: FASTING LIPID/CMET FEW DAYS PRIOR TO LIPID CLINIC FOLLOW UP [facility] DOWNSTAIRS ON FIRST FLOOR  Testing/Procedures: NONE  Follow-Up: Your physician recommends that you schedule a follow-up appointment in: [place]  Your physician recommends that you schedule a follow-up appointment in: ABOUT 4 WEEKS IN [place]  If you need a refill on your cardiac medications before your next appointment, please call your pharmacy.

## 2016-09-02 ENCOUNTER — Other Ambulatory Visit: Payer: Self-pay | Admitting: Internal Medicine

## 2016-09-02 ENCOUNTER — Other Ambulatory Visit (HOSPITAL_COMMUNITY)
Admission: RE | Admit: 2016-09-02 | Discharge: 2016-09-02 | Disposition: A | Discharge status: Home / Self Care | Payer: Medicare Other | Source: Ambulatory Visit | Attending: Internal Medicine | Admitting: Internal Medicine

## 2016-09-02 DIAGNOSIS — D126 Benign neoplasm of colon, unspecified: Secondary | ICD-10-CM | POA: Diagnosis not present

## 2016-09-02 DIAGNOSIS — Z1151 Encounter for screening for human papillomavirus (HPV): Secondary | ICD-10-CM | POA: Diagnosis not present

## 2016-09-02 DIAGNOSIS — Z01419 Encounter for gynecological examination (general) (routine) without abnormal findings: Secondary | ICD-10-CM | POA: Diagnosis not present

## 2016-09-02 DIAGNOSIS — N941 Unspecified dyspareunia: Secondary | ICD-10-CM | POA: Diagnosis not present

## 2016-09-02 DIAGNOSIS — E7800 Pure hypercholesterolemia, unspecified: Secondary | ICD-10-CM | POA: Diagnosis not present

## 2016-09-04 LAB — CYTOLOGY - PAP
DIAGNOSIS: NEGATIVE
HPV (WINDOPATH): NOT DETECTED

## 2016-09-22 DIAGNOSIS — E785 Hyperlipidemia, unspecified: Secondary | ICD-10-CM | POA: Diagnosis not present

## 2016-09-23 ENCOUNTER — Ambulatory Visit
Admission: RE | Admit: 2016-09-23 | Discharge: 2016-09-23 | Disposition: A | Discharge status: Home / Self Care | Payer: Medicare Other | Source: Ambulatory Visit | Attending: Internal Medicine | Admitting: Internal Medicine

## 2016-09-23 DIAGNOSIS — Z1231 Encounter for screening mammogram for malignant neoplasm of breast: Secondary | ICD-10-CM

## 2016-09-23 DIAGNOSIS — M8588 Other specified disorders of bone density and structure, other site: Secondary | ICD-10-CM | POA: Diagnosis not present

## 2016-09-23 LAB — COMPREHENSIVE METABOLIC PANEL WITH GFR
ALT: 18 U/L (ref 6–29)
AST: 22 U/L (ref 10–35)
Albumin: 3.9 g/dL (ref 3.6–5.1)
Alkaline Phosphatase: 80 U/L (ref 33–130)
BUN: 18 mg/dL (ref 7–25)
CO2: 28 mmol/L (ref 20–31)
Calcium: 9.8 mg/dL (ref 8.6–10.4)
Chloride: 106 mmol/L (ref 98–110)
Creat: 1.01 mg/dL — ABNORMAL HIGH (ref 0.60–0.93)
Glucose, Bld: 92 mg/dL (ref 65–99)
Potassium: 4.6 mmol/L (ref 3.5–5.3)
Sodium: 142 mmol/L (ref 135–146)
Total Bilirubin: 0.6 mg/dL (ref 0.2–1.2)
Total Protein: 6.7 g/dL (ref 6.1–8.1)

## 2016-09-23 LAB — LIPID PANEL
CHOL/HDL RATIO: 4.7 ratio (ref <5.0)
Cholesterol: 259 mg/dL — ABNORMAL HIGH (ref <200)
HDL: 55 mg/dL (ref >50)
LDL Cholesterol: 169 mg/dL — ABNORMAL HIGH
TRIGLYCERIDES: 177 mg/dL — AB (ref <150)
VLDL: 35 mg/dL — ABNORMAL HIGH (ref <30)

## 2016-09-24 ENCOUNTER — Ambulatory Visit: Payer: Medicare Other

## 2016-10-16 DIAGNOSIS — M79641 Pain in right hand: Secondary | ICD-10-CM | POA: Diagnosis not present

## 2016-10-16 DIAGNOSIS — M25511 Pain in right shoulder: Secondary | ICD-10-CM | POA: Diagnosis not present

## 2016-10-21 ENCOUNTER — Ambulatory Visit (INDEPENDENT_AMBULATORY_CARE_PROVIDER_SITE_OTHER): Payer: Medicare Other | Admitting: Pharmacist Clinician (PhC)/ Clinical Pharmacy Specialist

## 2016-10-21 DIAGNOSIS — E785 Hyperlipidemia, unspecified: Secondary | ICD-10-CM | POA: Diagnosis not present

## 2016-10-21 NOTE — Assessment & Plan Note (Signed)
Patient with no clinical ASCVD but elevated cholesterol with untreated LDL at 217.  She started pravastatin 5mg  tiw about 2 months ago and appears to be tolerating without concern.  Adamant about not adding any other medications at this time.  Gave information about ezetimibe and Repatha, but she would like to continue with dietary changes and pravastatin. She was encouraged to start some simple exercises and look into Silver Sneakers at her local [employer].   Will have her repeat labs in February and then see her after that for follow up

## 2016-10-21 NOTE — Progress Notes (Signed)
10/21/2016 [name] 1946-05-31 [id number]   HPI:  [name] is a 70 y.o. female patient of Dr Oval Linsey, who presents today for a lipid clinic evaluation.  She was originally referred by her PCP (at [facility]) due to hyperlipidemia with an total cholesterol > 300 and LDL > 200.  She has tried both atorvastatin and rosuvastatin in the past, and both caused her to have myalgias, chest pain and flushing, within a week of starting each medication.  When she saw Dr. Oval Linsey she was started on pravastatin 5 mg three times weekly and today she admits feeling well, much to her own surprise.  She is very adamant about not starting any more medications, has recently started a diet from a book called "eat Skinny", and would prefer to wait and see how well the pravastatin works before making any changes.    Current Medications:  Pravastatin 5 mg each MWF  Risk Factors:  No ASVCD, [place] Lipid score of 4 (LDL 216 plus father died from CAD at 36)  Cholesterol Goals:  LDL < 100   Intolerant/previously tried:  Atorvastatin and rosuvastatin;  Doses unknown, took several years ago thru PCP at [place] history:   Father died from CAD at age 26; some family history of heart disease, but most family members live into their 3's.  Mother still living now  Diet:   Started eating based on book "Eat Skinny"; apparently eliminates many white carbs, encourages [place], chicken or fish, fresh/frozen fruits/vegetables.    Believes she has lost about 7 pounds to date.    Exercise:    Does not like to exercise; has some spinal stenosis and is followed at [facility].  Encouraged her to [facility], as they usually accommodate patients mobility issues.     Labs:  09-2016   TC 259  TG 177, HDL 35, LDL 169 (pravastatin 5 mg tiw x 3 weeks) 05-2016   TC 341  TG 334, HDL 58, LDL 217   Current Outpatient Prescriptions  Medication Sig Dispense Refill  . levothyroxine (SYNTHROID,  LEVOTHROID) 88 MCG tablet Take 88 mcg by mouth daily before breakfast.    . pravastatin (PRAVACHOL) 10 MG tablet TAKE 1/2 TABLET BY MOUTH ON Monday, Wednesday, AND Friday ONLY OR AS DIRECTED BY LIPID CLINIC 15 tablet 5  . sertraline (ZOLOFT) 25 MG tablet Take 25 mg by mouth daily.     No current facility-administered medications for this visit.     Allergies  Allergen Reactions  . Codeine   . Demerol [Meperidine]   . Penicillins Hives    Past Medical History:  Diagnosis Date  . Colonoscopy causing post-procedural bleeding   . Endometriosis   . Hyperlipidemia   . Obesity 08/25/2016  . Thyroid disease     There were no vitals taken for this visit.   Hyperlipidemia Patient with no clinical ASCVD but elevated cholesterol with untreated LDL at 217.  She started pravastatin 5mg  tiw about 2 months ago and appears to be tolerating without concern.  Adamant about not adding any other medications at this time.  Gave information about ezetimibe and Repatha, but she would like to continue with dietary changes and pravastatin. She was encouraged to start some simple exercises and look into Silver S[employer]akers at her local [employer].   Will have her repeat labs in February and then see her after that for follow up   Tommy Medal PharmD CPP [place]

## 2016-10-21 NOTE — Patient Instructions (Signed)
Continue with your dietary changes We would encourage you to do simple, chair exercises as able  Will repeat cholesterol and liver function labs in February, then make a decision about medications.   Cholesterol Cholesterol is a fat. Your body needs a small amount of cholesterol. Cholesterol (plaque) may build up in your blood vessels (arteries). That makes you more likely to have a heart attack or stroke. You cannot feel your cholesterol level. Having a blood test is the only way to find out if your level is high. Keep your test results. Work with your doctor to keep your cholesterol at a good level. What do the results mean?  Total cholesterol is how much cholesterol is in your blood.  LDL is bad cholesterol. This is the type that can build up. Try to have low LDL.  HDL is good cholesterol. It cleans your blood vessels and carries LDL away. Try to have high HDL.  Triglycerides are fat that the body can store or burn for energy. What are good levels of cholesterol?  Total cholesterol below 200.  LDL below 100 is good for people who have health risks. LDL below 70 is good for people who have very high risks.  HDL above 40 is good. It is best to have HDL of 60 or higher.  Triglycerides below 150. How can I lower my cholesterol? Diet  Follow your diet program as told by your doctor.  Choose fish, white meat chicken, or [place] that is roasted or baked. Try not to eat red meat, fried foods, sausage, or lunch meats.  Eat lots of fresh fruits and vegetables.  Choose whole grains, beans, pasta, potatoes, and cereals.  Choose olive oil, corn oil, or canola oil. Only use small amounts.  Try not to eat butter, mayonnaise, shortening, or palm kernel oils.  Try not to eat foods with trans fats.  Choose low-fat or nonfat dairy foods.  Drink skim or nonfat milk.  Eat low-fat or nonfat yogurt and cheeses.  Try not to drink whole milk or cream.  Try not to eat ice cream, egg yolks,  or full-fat cheeses.  Healthy desserts include angel food cake, ginger snaps, animal crackers, hard candy, popsicles, and low-fat or nonfat frozen yogurt. Try not to eat pastries, cakes, pies, and cookies. Exercise  Follow your exercise program as told by your doctor.  Be more active. Try gardening, walking, and taking the stairs.  Ask your doctor about ways that you can be more active. Medicine  Take over-the-counter and prescription medicines only as told by your doctor. This information is not intended to replace advice given to you by your health care provider. Make sure you discuss any questions you have with your health care provider. Document Released: 01/29/2009 Document Revised: 06/03/2016 Document Reviewed: 05/14/2016 Elsevier Interactive Patient Education  2017 [employer].

## 2016-10-26 DIAGNOSIS — S63636A Sprain of interphalangeal joint of right little finger, initial encounter: Secondary | ICD-10-CM | POA: Diagnosis not present

## 2016-10-26 DIAGNOSIS — M7551 Bursitis of right shoulder: Secondary | ICD-10-CM | POA: Diagnosis not present

## 2016-11-20 ENCOUNTER — Ambulatory Visit (INDEPENDENT_AMBULATORY_CARE_PROVIDER_SITE_OTHER): Payer: Medicare Other | Admitting: Cardiovascular Disease

## 2016-11-20 ENCOUNTER — Encounter: Payer: Self-pay | Admitting: Cardiovascular Disease

## 2016-11-20 VITALS — BP 120/82 | HR 62 | Ht 63.0 in | Wt 170.2 lb

## 2016-11-20 DIAGNOSIS — E7800 Pure hypercholesterolemia, unspecified: Secondary | ICD-10-CM

## 2016-11-20 DIAGNOSIS — M791 Myalgia, unspecified site: Secondary | ICD-10-CM

## 2016-11-20 HISTORY — DX: Myalgia, unspecified site: M79.10

## 2016-11-20 NOTE — Progress Notes (Signed)
Cardiology Office Note   Date:  11/20/2016   ID:  KRYSTALYNN ACORS, DOB [birth year], MRN [id number]  PCP:  Kelton Pillar, MD  Cardiologist:   Skeet Latch, MD   Chief Complaint  Patient presents with  . Follow-up    3 months     History of Present Illness: [name] is a 72 y.o. female with familial hyperlipidemia, Graves disease, and depression who presents for follow up.  [name] reports several years of hyperlipidemia. She has previously tried atorvastatin and rosuvastatin but developed chest pain and shortness of breath with these medications. During that time she underwent cardiac catheterization in 2011 that reportedly showed mild CAD. She was also noted to have a murmur in 2014 and had an echo that was reportedly normal.  At her last appointment she was started on pravastatin 5mg  three times per week.  She followed up with our pharmacist and was tolerating it well.  She will have her lipids rechecked in February.  [name] had a mechanical fall in early December.  Since then she reports pain in bilateral upper arms.  The pain is constantly there and rest and with exertion. It is worse at night and makes it hard for her to sleep. She does also note some mild shortness of breath sometimes feels more aware of her breathing. She denies orthopnea, PND, or lower extremity edema. She also has not noted any chest pain. She sometimes feels her hands and feet tingling, though she wonders if this is due to her spinal stenosis. She has not been exercising lately due to the cold weather.  [name] has several family members with CAD. Her father died of a heart attack at age 44 but she remembers him using angina tablets for years before then. Her sister has multiple stents, the first of which was implanted in her late 80s. Her mother also had coronary disease in her 68s.   Past Medical History:  Diagnosis Date  . Colonoscopy causing post-procedural bleeding   .  Endometriosis   . Hyperlipidemia   . Myalgia 11/20/2016  . Obesity 08/25/2016  . Thyroid disease     Past Surgical History:  Procedure Laterality Date  . CARDIAC CATHETERIZATION       Current Outpatient Prescriptions  Medication Sig Dispense Refill  . levothyroxine (SYNTHROID, LEVOTHROID) 88 MCG tablet Take 88 mcg by mouth daily before breakfast.    . sertraline (ZOLOFT) 25 MG tablet Take 25 mg by mouth daily.     No current facility-administered medications for this visit.     Allergies:   Codeine; Demerol [meperidine]; and Penicillins    Social History:  The patient  reports that she has quit smoking. She smoked 1.00 pack per day. She has never used smokeless tobacco. She reports that she drinks alcohol. She reports that she does not use drugs.   Family History:  The patient's  family history includes CAD in her father, mother, and sister; Dementia in her brother and mother; Kidney disease in her brother; Lymphoma in her sister.    ROS:  Please see the history of present illness.   Otherwise, review of systems are positive for tingling in arms and legs.   All other systems are reviewed and negative.    PHYSICAL EXAM: VS:  BP 120/82   Pulse 62   Ht 5\' 3"  (1.6 m)   Wt 77.2 kg (170 lb 3.2 oz)   BMI 30.15 kg/m  , BMI Body mass index  is 30.15 kg/m. GENERAL:  Well appearing HEENT:  Pupils equal round and reactive, fundi not visualized, oral mucosa unremarkable NECK:  No jugular venous distention, waveform within normal limits, carotid upstroke brisk and symmetric, no bruits LYMPHATICS:  No cervical adenopathy LUNGS:  Clear to auscultation bilaterally HEART:  RRR.  PMI not displaced or sustained,S1 and S2 within normal limits, no S3, no S4, no clicks, no rubs, no murmurs ABD:  Flat, positive bowel sounds normal in frequency in pitch, no bruits, no rebound, no guarding, no midline pulsatile mass, no hepatomegaly, no splenomegaly EXT:  2 plus pulses throughout, no edema, no  cyanosis no clubbing SKIN:  No rashes no nodules NEURO:  Cranial nerves II through XII grossly intact, motor grossly intact throughout PSYCH:  Cognitively intact, oriented to person place and time  EKG:  EKG is not ordered today. The ekg ordered today demonstrates sinus rhythm. Rate 60 bpm. Nonspecific T wave abnormalities.   Recent Labs: 09/22/2016: ALT 18; BUN 18; Creat 1.01; Potassium 4.6; Sodium 142    Lipid Panel    Component Value Date/Time   CHOL 259 (H) 09/22/2016 0918   TRIG 177 (H) 09/22/2016 0918   HDL 55 09/22/2016 0918   CHOLHDL 4.7 09/22/2016 0918   VLDL 35 (H) 09/22/2016 0918   LDLCALC 169 (H) 09/22/2016 0918   06/03/16:  WBC 5.2, hemoglobin of 0.4, hematocrit 37.4, platelets 222 Sodium 140, potassium 4.3 BUN 20, creatinine 0.96 AST 24, ALT 22 TSH 0.37 Total cholesterol 341, triglycerides 334, HDL 58, LDL 217  Wt Readings from Last 3 Encounters:  11/20/16 77.2 kg (170 lb 3.2 oz)  08/25/16 79.5 kg (175 lb 3.2 oz)      ASSESSMENT AND PLAN:  # Hyperlipidemia: # Myalgias: [name]'s cholesterol improved on pravastatin. However, I am suspicious that this is causing myalgias. This seems to be the cause of her arm pain. She was tender to palpation of bilateral upper arms and thighs. We will stop pravastatin and reassess her symptoms in 2 weeks. We will recheck her lipids and CMP in 6 weeks. Given her history of mild coronary artery disease she might qualify for a PCSK9 inhibitor.  She has not tolerated other statins in the past.  I also encouraged her to join a gym and started exercising 30-45 minutes most days of the week. She is breast understanding.   Current medicines are reviewed at length with the patient today.  The patient does not have concerns regarding medicines.  The following changes have been made:  Stop Pravastatin   Labs/ tests ordered today include:   No orders of the defined types were placed in this encounter.    Disposition:   FU with  Ashlley Booher C. Oval Linsey, MD, [facility] in 6 weeks   This note was written with the assistance of speech recognition software.  Please excuse any transcriptional errors.  Signed, Blakleigh Straw C. Oval Linsey, MD, [facility]  11/20/2016 1:24 PM    [place]

## 2016-11-20 NOTE — Patient Instructions (Addendum)
Medication Instructions:  STOP PRAVASTATIN   Labwork: FASTING LABS DAY OR TWO PRIOR TO YOUR FOLLOW UP  [facility] ON THE FIRST FLOOR  Testing/Procedures: NONE  Follow-Up: Your physician recommends that you schedule a follow-up appointment in: 6 WEEKS  01/06/17 ARRIVE AT 11:15 FOR 11:30  CALL IN A COUPLE WEEKS WITH UPDATE SINCE STOPPING PRAVASTATIN   If you need a refill on your cardiac medications before your next appointment, please call your pharmacy.

## 2016-12-07 DIAGNOSIS — M7551 Bursitis of right shoulder: Secondary | ICD-10-CM | POA: Diagnosis not present

## 2016-12-07 DIAGNOSIS — S63636D Sprain of interphalangeal joint of right little finger, subsequent encounter: Secondary | ICD-10-CM | POA: Diagnosis not present

## 2016-12-07 DIAGNOSIS — M545 Low back pain: Secondary | ICD-10-CM | POA: Diagnosis not present

## 2016-12-11 DIAGNOSIS — S63636D Sprain of interphalangeal joint of right little finger, subsequent encounter: Secondary | ICD-10-CM | POA: Diagnosis not present

## 2016-12-15 DIAGNOSIS — J209 Acute bronchitis, unspecified: Secondary | ICD-10-CM | POA: Diagnosis not present

## 2016-12-15 DIAGNOSIS — R05 Cough: Secondary | ICD-10-CM | POA: Diagnosis not present

## 2016-12-15 DIAGNOSIS — R0981 Nasal congestion: Secondary | ICD-10-CM | POA: Diagnosis not present

## 2016-12-17 ENCOUNTER — Telehealth: Payer: Self-pay | Admitting: *Deleted

## 2016-12-17 NOTE — Telephone Encounter (Signed)
Left message to call back  

## 2016-12-17 NOTE — Telephone Encounter (Signed)
New message      Returning your call please call

## 2016-12-17 NOTE — Telephone Encounter (Signed)
After OV 1/5 patient was to call back in a couple weeks with update Left message to call back

## 2016-12-28 ENCOUNTER — Other Ambulatory Visit: Payer: Self-pay | Admitting: Cardiovascular Disease

## 2016-12-28 DIAGNOSIS — E785 Hyperlipidemia, unspecified: Secondary | ICD-10-CM | POA: Diagnosis not present

## 2016-12-28 LAB — LIPID PANEL
CHOL/HDL RATIO: 5.3 ratio — AB (ref <5.0)
Cholesterol: 297 mg/dL — ABNORMAL HIGH (ref <200)
HDL: 56 mg/dL (ref >50)
LDL CALC: 192 mg/dL — AB (ref <100)
Triglycerides: 246 mg/dL — ABNORMAL HIGH (ref <150)
VLDL: 49 mg/dL — AB (ref <30)

## 2016-12-28 LAB — HEPATIC FUNCTION PANEL
ALBUMIN: 3.7 g/dL (ref 3.6–5.1)
ALT: 21 U/L (ref 6–29)
AST: 20 U/L (ref 10–35)
Alkaline Phosphatase: 80 U/L (ref 33–130)
BILIRUBIN TOTAL: 0.4 mg/dL (ref 0.2–1.2)
Bilirubin, Direct: 0.1 mg/dL (ref <=0.2)
Indirect Bilirubin: 0.3 mg/dL (ref 0.2–1.2)
Total Protein: 6.5 g/dL (ref 6.1–8.1)

## 2017-01-05 NOTE — Telephone Encounter (Signed)
Patient has ov tomorrow

## 2017-01-06 ENCOUNTER — Ambulatory Visit (INDEPENDENT_AMBULATORY_CARE_PROVIDER_SITE_OTHER): Payer: Medicare Other | Admitting: Cardiovascular Disease

## 2017-01-06 ENCOUNTER — Encounter: Payer: Self-pay | Admitting: Cardiovascular Disease

## 2017-01-06 VITALS — BP 141/73 | HR 60 | Ht 60.0 in | Wt 170.4 lb

## 2017-01-06 DIAGNOSIS — S63636D Sprain of interphalangeal joint of right little finger, subsequent encounter: Secondary | ICD-10-CM | POA: Diagnosis not present

## 2017-01-06 DIAGNOSIS — E7800 Pure hypercholesterolemia, unspecified: Secondary | ICD-10-CM | POA: Diagnosis not present

## 2017-01-06 NOTE — Patient Instructions (Signed)
Medication Instructions:  Your physician recommends that you continue on your current medications as directed. Please refer to the Current Medication list given to you today.  Labwork: NONE  Testing/Procedures: NONE  Follow-Up: Your physician recommends that you schedule a follow-up appointment in: [place] physician wants you to follow-up in: [place] will receive a reminder letter in the mail two months in advance. If you don't receive a letter, please call our office to schedule the follow-up appointment.  If you need a refill on your cardiac medications before your next appointment, please call your pharmacy.

## 2017-01-06 NOTE — Progress Notes (Signed)
Cardiology Office Note   Date:  01/06/2017   ID:  LILYANN GOETZ, DOB 1946/04/30, MRN [id number]  PCP:  Kelton Pillar, MD  Cardiologist:   Skeet Latch, MD   Chief Complaint  Patient presents with  . Follow-up     History of Present Illness: [name] is a 71 y.o. female with familial hyperlipidemia, Graves disease, and depression who presents for follow up.  Ms. Sapia reports several years of hyperlipidemia. She has previously tried atorvastatin and rosuvastatin but developed chest pain and shortness of breath with these medications. During that time she underwent cardiac catheterization in 2011 that reportedly showed mild CAD. She was also noted to have a murmur in 2014 and had an echo that was reportedly normal.  She was started on pravastatin 5mg  three times per week.  She followed up with our pharmacist and was tolerating it well but her lipids were not controlled so it was increased and she developed myalgias.  At her last appointment pravastatin was stopped due to myalgias.  Repeat labs showed a total cholesterol of 297 with LDL 192.    Ms. Handelman has been feeling well.   She denies chest pain or shortness of breath. She continues to be unable to exercise due to spinal stenosis. She wtaches her diet and limits her fried and fatty food intake.  Ms. Cline has several family members with CAD. Her father died of a heart attack in his 72s but she remembers him using angina tablets for years before then. Her sister has multiple stents, the first of which was implanted in her late 44s. Her mother also had coronary disease in her 21s.   Past Medical History:  Diagnosis Date  . Colonoscopy causing post-procedural bleeding   . Endometriosis   . Hyperlipidemia   . Myalgia 11/20/2016  . Obesity 08/25/2016  . Thyroid disease     Past Surgical History:  Procedure Laterality Date  . CARDIAC CATHETERIZATION       Current Outpatient Prescriptions  Medication Sig  Dispense Refill  . levothyroxine (SYNTHROID, LEVOTHROID) 88 MCG tablet Take 88 mcg by mouth daily before breakfast.    . sertraline (ZOLOFT) 25 MG tablet Take 25 mg by mouth daily.     No current facility-administered medications for this visit.     Allergies:   Atorvastatin; Codeine; Demerol [meperidine]; Penicillins; Pravastatin; and Rosuvastatin    Social History:  The patient  reports that she has quit smoking. She smoked 1.00 pack per day. She has never used smokeless tobacco. She reports that she drinks alcohol. She reports that she does not use drugs.   Family History:  The patient's  family history includes CAD in her father, mother, and sister; Dementia in her brother and mother; Kidney disease in her brother; Lymphoma in her sister.    ROS:  Please see the history of present illness.   Otherwise, review of systems are positive for tingling in arms and legs.   All other systems are reviewed and negative.    PHYSICAL EXAM: VS:  BP (!) 141/73   Pulse 60   Ht 5' (1.524 m)   Wt 77.3 kg (170 lb 6.4 oz)   BMI 33.28 kg/m  , BMI Body mass index is 33.28 kg/m. GENERAL:  Well appearing HEENT:  Pupils equal round and reactive, fundi not visualized, oral mucosa unremarkable NECK:  No jugular venous distention, waveform within normal limits, carotid upstroke brisk and symmetric, no bruits LYMPHATICS:  No cervical adenopathy  LUNGS:  Bilateral upper lobe rhonchi and wheezes HEART:  RRR.  PMI not displaced or sustained,S1 and S2 within normal limits, no S3, no S4, no clicks, no rubs, no murmurs ABD:  Flat, positive bowel sounds normal in frequency in pitch, no bruits, no rebound, no guarding, no midline pulsatile mass, no hepatomegaly, no splenomegaly EXT:  2 plus pulses throughout, no edema, no cyanosis no clubbing SKIN:  No rashes no nodules NEURO:  Cranial nerves II through XII grossly intact, motor grossly intact throughout PSYCH:  Cognitively intact, oriented to person place and  time  EKG:  EKG is not ordered today. The ekg ordered today demonstrates sinus rhythm. Rate 60 bpm. Nonspecific T wave abnormalities.   Recent Labs: 09/22/2016: BUN 18; Creat 1.01; Potassium 4.6; Sodium 142 12/28/2016: ALT 21    Lipid Panel    Component Value Date/Time   CHOL 297 (H) 12/28/2016 0924   TRIG 246 (H) 12/28/2016 0924   HDL 56 12/28/2016 0924   CHOLHDL 5.3 (H) 12/28/2016 0924   VLDL 49 (H) 12/28/2016 0924   LDLCALC 192 (H) 12/28/2016 0924   06/03/16:  WBC 5.2, hemoglobin of 0.4, hematocrit 37.4, platelets 222 Sodium 140, potassium 4.3 BUN 20, creatinine 0.96 AST 24, ALT 22 TSH 0.37 Total cholesterol 341, triglycerides 334, HDL 58, LDL 217  Wt Readings from Last 3 Encounters:  01/06/17 77.3 kg (170 lb 6.4 oz)  11/20/16 77.2 kg (170 lb 3.2 oz)  08/25/16 79.5 kg (175 lb 3.2 oz)      ASSESSMENT AND PLAN:  # Hyperlipidemia: # Statin intolerance [name]'s cholesterol improved on pravastatin. However,  She was not at goal and was unable to tolerate higher doses.she likely has familial hyperlipidemia given her LDL greater than 190 and family history of premature coronary disease. We wil refer her back to our lipid clinic. I suspect that Zetia would not get her to goal of LDL <70.  I would like her to start a PCSK9 inhibitor and she is amenable to this.   Current medicines are reviewed at length with the patient today.  The patient does not have concerns regarding medicines.  The following changes have been made:  none  Labs/ tests ordered today include:   No orders of the defined types were placed in this encounter.    Disposition:   FU with Fransisca Shawn C. Oval Linsey, MD, [facility] in 6 weeks   This note was written with the assistance of speech recognition software.  Please excuse any transcriptional errors.  Signed, Naylani Bradner C. Oval Linsey, MD, [facility]  01/06/2017 1:03 PM    [place]

## 2017-01-18 ENCOUNTER — Ambulatory Visit (INDEPENDENT_AMBULATORY_CARE_PROVIDER_SITE_OTHER): Payer: Medicare Other | Admitting: Pharmacist Clinician (PhC)/ Clinical Pharmacy Specialist

## 2017-01-18 DIAGNOSIS — E7800 Pure hypercholesterolemia, unspecified: Secondary | ICD-10-CM

## 2017-01-18 NOTE — Patient Instructions (Signed)

## 2017-01-18 NOTE — Progress Notes (Signed)
01/18/2017 KRIST RUFO 10/26/1946 [id number]   HPI:  [name] is a 71 y.o. female patient of Dr Oval Linsey, who presents today for a lipid clinic evaluation.  She was originally referred by her PCP (at [facility]) due to hyperlipidemia with an total cholesterol of 302 and LDL of 216.  She has tried both atorvastatin and rosuvastatin in the past, and both caused her to have myalgias, chest pain and flushing, within a week of starting each medication.  She was then challenged with pravastatin 5 mg three times weekly.  She managed this for 2-3 months before the myalgias became severe enough to stop the medication.    Her cardiac history is significant for CAD noted on heart catheterization done in 2011.  No stenting was needed at that time and she was treated medically.    Current Medications:  No medications  Risk Factors:   [place] Lipid score of 4 (LDL 216 plus father died from CAD at 94)  Cholesterol Goals:  LDL < 100   Intolerant/previously tried:  Atorvastatin and rosuvastatin; doses unknown, took several years ago thru PCP at [place].  We re-challenged her with pravastatin 5 mg 3 times weekly, lasted 2-3 months before myalgias appeared.  Patient has since discontinued  Family history:   Father died from CAD at age 43; some family history of heart disease, but most family members live into their 52's.  Mother passed at age 32; one sister with CAD  Diet:   Has been dieting for past couple of months, down about 5-7 pounds to date.  Cut out desserts and eased up on starchy foods.  Trying to follow Mediterranean diet.   Exercise:    Does not like to exercise; has some spinal stenosis and is followed at [facility].  Encouraged her to look into [facility], as they usually accommodate patients mobility issues.     Labs:  12-2015   TC 297  TG 246, HDL 56 LDL 192 (no medications) 09-2016   TC 259  TG 177, HDL 35, LDL 169 (pravastatin 5 mg tiw x 3 weeks) 05-2016   TC 341  TG  334, HDL 58, LDL 217   Current Outpatient Prescriptions  Medication Sig Dispense Refill  . levothyroxine (SYNTHROID, LEVOTHROID) 88 MCG tablet Take 88 mcg by mouth daily before breakfast.    . sertraline (ZOLOFT) 25 MG tablet Take 25 mg by mouth daily.     No current facility-administered medications for this visit.     Allergies  Allergen Reactions  . Atorvastatin   . Codeine   . Demerol [Meperidine]   . Penicillins Hives  . Pravastatin   . Rosuvastatin     Past Medical History:  Diagnosis Date  . Colonoscopy causing post-procedural bleeding   . Endometriosis   . Hyperlipidemia   . Myalgia 11/20/2016  . Obesity 08/25/2016  . Thyroid disease     There were no vitals taken for this visit.   No problem-specific Assessment & Plan notes found for this encounter.   Tommy Medal PharmD CPP [facility]

## 2017-01-18 NOTE — Assessment & Plan Note (Signed)
Patient with history of familial hyperlipidemia and CAD noted on 2011 heart catheterization.  Unable to tolerate statin medications due to lifestyle limiting myalgias.  Will start process to get her on Repatha 140 mg injections.  Patient will continue to work on healthy dietary choices and increasing activity.

## 2017-02-16 ENCOUNTER — Other Ambulatory Visit: Payer: Self-pay | Admitting: Pharmacist Clinician (PhC)/ Clinical Pharmacy Specialist

## 2017-02-16 MED ORDER — EZETIMIBE 10 MG PO TABS: 10.0000 mg | ORAL_TABLET | Freq: Every day | ORAL | 3 refills | Status: DC

## 2017-02-16 NOTE — Telephone Encounter (Signed)
Insurance Consulting civil engineer) refused Repatha until patient has trial with ezetimibe.  Patient notified and will try for 6 weeks.  Will repeat labs at that time.

## 2017-05-03 ENCOUNTER — Telehealth: Payer: Self-pay | Admitting: Pharmacist Clinician (PhC)/ Clinical Pharmacy Specialist

## 2017-05-03 DIAGNOSIS — E7800 Pure hypercholesterolemia, unspecified: Secondary | ICD-10-CM

## 2017-05-03 NOTE — Telephone Encounter (Signed)
Patient unable to tolerate ezetimibe, also caused muscle aches.  Would like to apply for approval of Repatha.  Has been 3 months since started ezetimibe trial, so will have her come for repeat labs this week.   Patient voiced understanding

## 2017-05-05 ENCOUNTER — Other Ambulatory Visit: Payer: Self-pay | Admitting: Pharmacist Clinician (PhC)/ Clinical Pharmacy Specialist

## 2017-05-05 MED ORDER — EVOLOCUMAB 140 MG/ML ~~LOC~~ SOAJ: 140.0000 mg | SUBCUTANEOUS | 12 refills | Status: DC

## 2017-05-10 ENCOUNTER — Other Ambulatory Visit: Payer: Self-pay | Admitting: Cardiovascular Disease

## 2017-05-10 DIAGNOSIS — E7800 Pure hypercholesterolemia, unspecified: Secondary | ICD-10-CM | POA: Diagnosis not present

## 2017-05-10 LAB — LIPID PANEL
CHOL/HDL RATIO: 4.1 ratio (ref 0.0–4.4)
Cholesterol, Total: 301 mg/dL — ABNORMAL HIGH (ref 100–199)
HDL: 74 mg/dL (ref >39)
LDL Calculated: 201 mg/dL — ABNORMAL HIGH (ref 0–99)
Triglycerides: 129 mg/dL (ref 0–149)
VLDL CHOLESTEROL CAL: 26 mg/dL (ref 5–40)

## 2017-05-27 DIAGNOSIS — L814 Other melanin hyperpigmentation: Secondary | ICD-10-CM | POA: Diagnosis not present

## 2017-05-27 DIAGNOSIS — D224 Melanocytic nevi of scalp and neck: Secondary | ICD-10-CM | POA: Diagnosis not present

## 2017-05-27 DIAGNOSIS — D225 Melanocytic nevi of trunk: Secondary | ICD-10-CM | POA: Diagnosis not present

## 2017-05-27 DIAGNOSIS — L821 Other seborrheic keratosis: Secondary | ICD-10-CM | POA: Diagnosis not present

## 2017-06-03 ENCOUNTER — Telehealth: Payer: Self-pay | Admitting: Pharmacist Clinician (PhC)/ Clinical Pharmacy Specialist

## 2017-06-03 NOTE — Telephone Encounter (Signed)
LMOM - need date of first dose Repatha and will order labs appropriately

## 2017-06-07 NOTE — Telephone Encounter (Signed)
LMOM; patient to call back 

## 2017-06-10 NOTE — Telephone Encounter (Signed)
3rd attempt to contact patient. Need to know date of 1st repatha dose

## 2017-06-23 DIAGNOSIS — R29818 Other symptoms and signs involving the nervous system: Secondary | ICD-10-CM | POA: Diagnosis not present

## 2017-06-23 DIAGNOSIS — F802 Mixed receptive-expressive language disorder: Secondary | ICD-10-CM | POA: Diagnosis not present

## 2017-06-23 DIAGNOSIS — E039 Hypothyroidism, unspecified: Secondary | ICD-10-CM | POA: Diagnosis not present

## 2017-06-23 DIAGNOSIS — G459 Transient cerebral ischemic attack, unspecified: Secondary | ICD-10-CM | POA: Diagnosis not present

## 2017-06-23 DIAGNOSIS — F329 Major depressive disorder, single episode, unspecified: Secondary | ICD-10-CM | POA: Diagnosis not present

## 2017-06-23 DIAGNOSIS — R9431 Abnormal electrocardiogram [ECG] [EKG]: Secondary | ICD-10-CM | POA: Diagnosis not present

## 2017-06-23 DIAGNOSIS — R4182 Altered mental status, unspecified: Secondary | ICD-10-CM | POA: Diagnosis not present

## 2017-06-23 DIAGNOSIS — I639 Cerebral infarction, unspecified: Secondary | ICD-10-CM | POA: Diagnosis not present

## 2017-06-23 DIAGNOSIS — E669 Obesity, unspecified: Secondary | ICD-10-CM | POA: Diagnosis not present

## 2017-06-23 DIAGNOSIS — E785 Hyperlipidemia, unspecified: Secondary | ICD-10-CM | POA: Diagnosis not present

## 2017-06-23 DIAGNOSIS — R55 Syncope and collapse: Secondary | ICD-10-CM | POA: Diagnosis not present

## 2017-06-23 DIAGNOSIS — I10 Essential (primary) hypertension: Secondary | ICD-10-CM | POA: Diagnosis not present

## 2017-06-23 DIAGNOSIS — Z0289 Encounter for other administrative examinations: Secondary | ICD-10-CM | POA: Diagnosis not present

## 2017-06-24 DIAGNOSIS — E669 Obesity, unspecified: Secondary | ICD-10-CM | POA: Diagnosis not present

## 2017-06-24 DIAGNOSIS — I080 Rheumatic disorders of both mitral and aortic valves: Secondary | ICD-10-CM | POA: Diagnosis not present

## 2017-06-24 DIAGNOSIS — E039 Hypothyroidism, unspecified: Secondary | ICD-10-CM | POA: Diagnosis not present

## 2017-06-24 DIAGNOSIS — R55 Syncope and collapse: Secondary | ICD-10-CM | POA: Diagnosis not present

## 2017-06-24 DIAGNOSIS — F329 Major depressive disorder, single episode, unspecified: Secondary | ICD-10-CM | POA: Diagnosis not present

## 2017-06-24 DIAGNOSIS — I10 Essential (primary) hypertension: Secondary | ICD-10-CM | POA: Diagnosis not present

## 2017-06-24 DIAGNOSIS — I169 Hypertensive crisis, unspecified: Secondary | ICD-10-CM | POA: Diagnosis not present

## 2017-06-24 DIAGNOSIS — R4781 Slurred speech: Secondary | ICD-10-CM | POA: Diagnosis not present

## 2017-06-24 DIAGNOSIS — E162 Hypoglycemia, unspecified: Secondary | ICD-10-CM | POA: Diagnosis not present

## 2017-06-24 DIAGNOSIS — R531 Weakness: Secondary | ICD-10-CM | POA: Diagnosis not present

## 2017-06-24 DIAGNOSIS — E785 Hyperlipidemia, unspecified: Secondary | ICD-10-CM | POA: Diagnosis not present

## 2017-06-24 DIAGNOSIS — G459 Transient cerebral ischemic attack, unspecified: Secondary | ICD-10-CM | POA: Diagnosis not present

## 2017-06-25 DIAGNOSIS — Z880 Allergy status to penicillin: Secondary | ICD-10-CM | POA: Diagnosis not present

## 2017-06-25 DIAGNOSIS — Z882 Allergy status to sulfonamides status: Secondary | ICD-10-CM | POA: Diagnosis not present

## 2017-06-25 DIAGNOSIS — E039 Hypothyroidism, unspecified: Secondary | ICD-10-CM | POA: Diagnosis not present

## 2017-06-25 DIAGNOSIS — I169 Hypertensive crisis, unspecified: Secondary | ICD-10-CM | POA: Diagnosis not present

## 2017-06-25 DIAGNOSIS — R55 Syncope and collapse: Secondary | ICD-10-CM | POA: Diagnosis not present

## 2017-06-25 DIAGNOSIS — R531 Weakness: Secondary | ICD-10-CM | POA: Diagnosis not present

## 2017-06-25 DIAGNOSIS — Z7982 Long term (current) use of aspirin: Secondary | ICD-10-CM | POA: Diagnosis not present

## 2017-06-25 DIAGNOSIS — I10 Essential (primary) hypertension: Secondary | ICD-10-CM | POA: Diagnosis not present

## 2017-06-25 DIAGNOSIS — E669 Obesity, unspecified: Secondary | ICD-10-CM | POA: Diagnosis not present

## 2017-06-25 DIAGNOSIS — Z87891 Personal history of nicotine dependence: Secondary | ICD-10-CM | POA: Diagnosis not present

## 2017-06-25 DIAGNOSIS — Z6832 Body mass index (BMI) 32.0-32.9, adult: Secondary | ICD-10-CM | POA: Diagnosis not present

## 2017-06-25 DIAGNOSIS — Z9089 Acquired absence of other organs: Secondary | ICD-10-CM | POA: Diagnosis not present

## 2017-06-25 DIAGNOSIS — E162 Hypoglycemia, unspecified: Secondary | ICD-10-CM | POA: Diagnosis not present

## 2017-06-25 DIAGNOSIS — F802 Mixed receptive-expressive language disorder: Secondary | ICD-10-CM | POA: Diagnosis present

## 2017-06-25 DIAGNOSIS — G459 Transient cerebral ischemic attack, unspecified: Secondary | ICD-10-CM | POA: Diagnosis not present

## 2017-06-25 DIAGNOSIS — F329 Major depressive disorder, single episode, unspecified: Secondary | ICD-10-CM | POA: Diagnosis not present

## 2017-06-25 DIAGNOSIS — Z9889 Other specified postprocedural states: Secondary | ICD-10-CM | POA: Diagnosis not present

## 2017-06-25 DIAGNOSIS — E785 Hyperlipidemia, unspecified: Secondary | ICD-10-CM | POA: Diagnosis not present

## 2017-06-28 ENCOUNTER — Telehealth: Payer: Self-pay | Admitting: Pharmacist Clinician (PhC)/ Clinical Pharmacy Specialist

## 2017-06-28 NOTE — Telephone Encounter (Signed)
Patient called to report that she had a TIA while in [place], was hospitalized x 3 days, released on Friday.  She reports having copies of her medical chart from [place] to share with Dr. Oval Linsey.    Repatha approved thru [employer] with a  $28/month copay.  Patient will contact them for shipment and call us back to schedule a time to come to the office for her first injection

## 2017-07-05 ENCOUNTER — Telehealth: Payer: Self-pay | Admitting: Pharmacist Clinician (PhC)/ Clinical Pharmacy Specialist

## 2017-07-05 ENCOUNTER — Ambulatory Visit: Payer: Medicare Other

## 2017-07-05 DIAGNOSIS — E785 Hyperlipidemia, unspecified: Secondary | ICD-10-CM

## 2017-07-05 NOTE — Telephone Encounter (Signed)
Reviewed injection technique with patient.  Gave herself first dose in office without concern.  Patient will repeat labs after 6 doses.  At that time would like to request 3 month supply per order to save challenges of ordering from specialty pharmacy.

## 2017-07-05 NOTE — Telephone Encounter (Signed)
TIA in [place] about 2 weeks ago  Added amlodipine 5 mg daily  Aspirin 325 mg qd

## 2017-07-21 ENCOUNTER — Ambulatory Visit (INDEPENDENT_AMBULATORY_CARE_PROVIDER_SITE_OTHER): Payer: Medicare Other | Admitting: Cardiovascular Disease

## 2017-07-21 ENCOUNTER — Encounter: Payer: Self-pay | Admitting: Cardiovascular Disease

## 2017-07-21 VITALS — BP 118/68 | HR 60 | Ht 60.0 in | Wt 172.0 lb

## 2017-07-21 DIAGNOSIS — G459 Transient cerebral ischemic attack, unspecified: Secondary | ICD-10-CM | POA: Diagnosis not present

## 2017-07-21 DIAGNOSIS — R1319 Other dysphagia: Secondary | ICD-10-CM

## 2017-07-21 DIAGNOSIS — G458 Other transient cerebral ischemic attacks and related syndromes: Secondary | ICD-10-CM

## 2017-07-21 DIAGNOSIS — E785 Hyperlipidemia, unspecified: Secondary | ICD-10-CM | POA: Diagnosis not present

## 2017-07-21 DIAGNOSIS — E039 Hypothyroidism, unspecified: Secondary | ICD-10-CM | POA: Diagnosis not present

## 2017-07-21 DIAGNOSIS — R0602 Shortness of breath: Secondary | ICD-10-CM | POA: Diagnosis not present

## 2017-07-21 DIAGNOSIS — E7800 Pure hypercholesterolemia, unspecified: Secondary | ICD-10-CM | POA: Diagnosis not present

## 2017-07-21 HISTORY — DX: Shortness of breath: R06.02

## 2017-07-21 HISTORY — DX: Transient cerebral ischemic attack, unspecified: G45.9

## 2017-07-21 NOTE — Patient Instructions (Signed)
Medication Instructions:  Your physician recommends that you continue on your current medications as directed. Please refer to the Current Medication list given to you today.  Labwork: NONE  Testing/Procedures: NONE  Follow-Up: Your physician wants you to follow-up in: [place] will receive a reminder letter in the mail two months in advance. If you don't receive a letter, please call our office to schedule the follow-up appointment.  You have been referred to [place]   If you need a refill on your cardiac medications before your next appointment, please call your pharmacy.

## 2017-07-21 NOTE — Progress Notes (Signed)
Cardiology Office Note   Date:  07/21/2017   ID:  CHESTINE Short, DOB 1946/10/23, MRN [id number]  PCP:  Lanice Shirts, MD  Cardiologist:   Skeet Latch, MD   Chief Complaint  Patient presents with  . Follow-up    6 months     History of Present Illness: [name] is a 71 y.o. female with familial hyperlipidemia, hypertension, TIA, Graves disease, and depression who presents for follow up.  Ms. [name] reports several years of hyperlipidemia. She has previously tried atorvastatin and rosuvastatin but developed chest pain and shortness of breath with these medications. During that time she underwent cardiac catheterization in 2011 that reportedly showed mild CAD. She was also noted to have a murmur in 2014 and had an echo that was reportedly normal.  She was started on pravastatin 5mg  three times per week.  She followed up with our pharmacist and was tolerating it well but her lipids were not controlled so it was increased and she developed myalgias. She was then started on Repatha and has been tolerating it well.  Her only complaint is cold symptoms for the day of the injection.  Since her last appointment Ms. [name] was admitted to the hospital in [place] 06/2017 with a TIA.  She had left sided weakness.  She underwent several tests that did not reveal the source of her symptoms.  She was started on aspirin.  She continues To have mild left lower extremity weakness. She is also noted tingling in bilateral hands that started around the same time as the TIA. She denies any chest pain.  At times she does feel that her breathing is a little labored. It feels hard to get a deep breath. This occurs both at rest and with exertion. She has a remote smoking history. She hasn't noted any lower extremity edema, orthopnea, or PND. Ms. [name] also reports difficulty swallowing. She sometimes feels as though she cannot get her food down and has to have her husband pound on her back.  This is very uncomfortable. She denies any impaction.  The symptoms have been ongoing for the last 2 years but seems to be getting worse lately.   Past Medical History:  Diagnosis Date  . Colonoscopy causing post-procedural bleeding   . Endometriosis   . Hyperlipidemia   . Myalgia 11/20/2016  . Obesity 08/25/2016  . Shortness of breath 07/21/2017  . Thyroid disease   . TIA (transient ischemic attack) 07/21/2017    Past Surgical History:  Procedure Laterality Date  . CARDIAC CATHETERIZATION       Current Outpatient Prescriptions  Medication Sig Dispense Refill  . amLODipine (NORVASC) 5 MG tablet Take 5 mg by mouth daily.    Marland Kitchen aspirin 325 MG tablet Take 325 mg by mouth daily.    . Evolocumab (REPATHA SURECLICK) 564 MG/ML SOAJ Inject 140 mg into the skin every 14 (fourteen) days. 2 pen 12  . ezetimibe (ZETIA) 10 MG tablet Take 1 tablet (10 mg total) by mouth daily. 30 tablet 3  . levothyroxine (SYNTHROID, LEVOTHROID) 88 MCG tablet Take 88 mcg by mouth daily before breakfast.    . sertraline (ZOLOFT) 25 MG tablet Take 25 mg by mouth daily.     No current facility-administered medications for this visit.     Allergies:   Atorvastatin; Codeine; Demerol [meperidine]; Penicillins; Pravastatin; and Rosuvastatin    Social History:  The patient  reports that she has quit smoking. She smoked 1.00 pack per day.  She has never used smokeless tobacco. She reports that she drinks alcohol. She reports that she does not use drugs.   Family History:  The patient's  family history includes CAD in her father, mother, and sister; Dementia in her brother and mother; Kidney disease in her brother; Lymphoma in her sister.    ROS:  Please see the history of present illness.  Otherwise, review of systems are positive for tingling in arms and legs.   All other systems are reviewed and negative.    PHYSICAL EXAM: VS:  BP 118/68   Pulse 60   Ht 5' (1.524 m)   Wt 78 kg (172 lb)   BMI 33.59 kg/m  , BMI  Body mass index is 33.59 kg/m. GENERAL:  Well appearing HEENT: Pupils equal round and reactive, fundi not visualized, oral mucosa unremarkable NECK:  No jugular venous distention, waveform within normal limits, carotid upstroke brisk and symmetric, no bruits, no thyromegaly LYMPHATICS:  No cervical adenopathy LUNGS:  Clear to auscultation bilaterally HEART:  RRR.  PMI not displaced or sustained,S1 and S2 within normal limits, no S3, no S4, no clicks, no rubs, II/VI early peaking systolic murmur at the LUSB. ABD:  Flat, positive bowel sounds normal in frequency in pitch, no bruits, no rebound, no guarding, no midline pulsatile mass, no hepatomegaly, no splenomegaly EXT:  2 plus pulses throughout, no edema, no cyanosis no clubbing SKIN:  No rashes no nodules NEURO:  Cranial nerves II through XII grossly intact, motor grossly intact throughout PSYCH:  Cognitively intact, oriented to person place and time   EKG:  EKG is ordered today. The ekg ordered 08/25/16 demonstrates sinus rhythm. Rate 60 bpm. Nonspecific T wave abnormalities. 07/21/17: Sinus rhythm.  Rate 60 bpm.   Recent Labs: 09/22/2016: BUN 18; Creat 1.01; Potassium 4.6; Sodium 142 12/28/2016: ALT 21    Lipid Panel    Component Value Date/Time   CHOL 301 (H) 05/10/2017 0924   TRIG 129 05/10/2017 0924   HDL 74 05/10/2017 0924   CHOLHDL 4.1 05/10/2017 0924   CHOLHDL 5.3 (H) 12/28/2016 0924   VLDL 49 (H) 12/28/2016 0924   LDLCALC 201 (H) 05/10/2017 0924   06/03/16:  WBC 5.2, hemoglobin of 0.4, hematocrit 37.4, platelets 222 Sodium 140, potassium 4.3 BUN 20, creatinine 0.96 AST 24, ALT 22 TSH 0.37 Total cholesterol 341, triglycerides 334, HDL 58, LDL 217  Wt Readings from Last 3 Encounters:  07/21/17 78 kg (172 lb)  01/06/17 77.3 kg (170 lb 6.4 oz)  11/20/16 77.2 kg (170 lb 3.2 oz)      ASSESSMENT AND PLAN:  # Hyperlipidemia: # Statin intolerance Ms. [name]'s cholesterol improved on pravastatin. However,  She was not  at goal and was unable to tolerate higher doses.she likely has familial hyperlipidemia given her LDL greater than 190 and family history of premature coronary disease. We wil refer her back to our lipid clinic. I suspect that Zetia would not get her to goal of LDL <70.  I would like her to start a PCSK9 inhibitor and she is amenable to this.  # Shortness of breath: We will get the echo report from her recent hospitalization.  If there are no findings to explain her shortness of breath we will refer her to pulmonary.   # TIA: Continue aspirin.  Can switch from 325mg  to 81mg  after 3 months.  Continue Repatha.    # Dysphagia: # Odynophagia: We will refer her to GI.   Current medicines are reviewed at length  with the patient today.  The patient does not have concerns regarding medicines.  The following changes have been made:  none  Labs/ tests ordered today include:   Orders Placed This Encounter  Procedures  . Ambulatory referral to Gastroenterology     Disposition:   FU with Carisha Kantor C. Oval Linsey, MD, [facility] in 6 months.  This note was written with the assistance of speech recognition software.  Please excuse any transcriptional errors.  Signed, Shamona Wirtz C. Oval Linsey, MD, [facility]  07/21/2017 5:25 PM    [facility]

## 2017-07-21 NOTE — Addendum Note (Signed)
Addended by: Alvina Filbert B on: 07/21/2017 06:04 PM   Modules accepted: Orders

## 2017-07-22 ENCOUNTER — Encounter: Payer: Self-pay | Admitting: Physician Assistant

## 2017-07-26 ENCOUNTER — Telehealth: Payer: Self-pay | Admitting: Cardiovascular Disease

## 2017-07-26 NOTE — Telephone Encounter (Signed)
Faxed Release signed by patient to [place] to obtain records per Dr Lavone [email or web address]

## 2017-07-28 DIAGNOSIS — Z23 Encounter for immunization: Secondary | ICD-10-CM | POA: Diagnosis not present

## 2017-07-28 DIAGNOSIS — R1319 Other dysphagia: Secondary | ICD-10-CM | POA: Diagnosis not present

## 2017-07-28 DIAGNOSIS — R0602 Shortness of breath: Secondary | ICD-10-CM | POA: Diagnosis not present

## 2017-07-28 DIAGNOSIS — I519 Heart disease, unspecified: Secondary | ICD-10-CM | POA: Diagnosis not present

## 2017-07-28 DIAGNOSIS — E7800 Pure hypercholesterolemia, unspecified: Secondary | ICD-10-CM | POA: Diagnosis not present

## 2017-07-28 DIAGNOSIS — G459 Transient cerebral ischemic attack, unspecified: Secondary | ICD-10-CM | POA: Diagnosis not present

## 2017-07-28 DIAGNOSIS — I10 Essential (primary) hypertension: Secondary | ICD-10-CM | POA: Diagnosis not present

## 2017-08-04 ENCOUNTER — Telehealth: Payer: Self-pay | Admitting: *Deleted

## 2017-08-04 MED ORDER — AMLODIPINE BESYLATE 5 MG PO TABS: 5.0000 mg | ORAL_TABLET | Freq: Every day | ORAL | 3 refills | Status: DC

## 2017-08-04 NOTE — Telephone Encounter (Signed)
Advised of echo results 

## 2017-08-04 NOTE — Telephone Encounter (Signed)
-----   Message from Skeet Latch, MD sent at 08/02/2017  7:34 PM EDT ----- Heart squeezes well.  Echo is otherwise pretty unremarkable.

## 2017-08-05 DIAGNOSIS — H1789 Other corneal scars and opacities: Secondary | ICD-10-CM | POA: Diagnosis not present

## 2017-08-05 DIAGNOSIS — H31003 Unspecified chorioretinal scars, bilateral: Secondary | ICD-10-CM | POA: Diagnosis not present

## 2017-08-05 DIAGNOSIS — H524 Presbyopia: Secondary | ICD-10-CM | POA: Diagnosis not present

## 2017-08-05 DIAGNOSIS — H25811 Combined forms of age-related cataract, right eye: Secondary | ICD-10-CM | POA: Diagnosis not present

## 2017-08-13 ENCOUNTER — Ambulatory Visit: Payer: Medicare Other | Admitting: Physician Assistant

## 2017-08-24 ENCOUNTER — Other Ambulatory Visit: Payer: Self-pay

## 2017-08-25 MED ORDER — EVOLOCUMAB 140 MG/ML ~~LOC~~ SOAJ: 140.0000 mg | SUBCUTANEOUS | 11 refills | Status: DC

## 2017-08-26 ENCOUNTER — Telehealth: Payer: Self-pay | Admitting: Cardiovascular Disease

## 2017-08-26 MED ORDER — EVOLOCUMAB 140 MG/ML ~~LOC~~ SOAJ
140.0000 mg | SUBCUTANEOUS | 3 refills | Status: DC
Start: 2017-08-26 — End: 2018-06-02

## 2017-08-26 NOTE — Telephone Encounter (Signed)
Follow up       *STAT* If patient is at the pharmacy, call can be transferred to refill team.   1. Which medications need to be refilled? (please list name of each medication and dose if known)  repatha injection  2. Which pharmacy/location (including street and city if local pharmacy) is medication to be sent to?  [facility]  3. Do they need a 30 day or 90 day supply? 90 day supply [employer] is wanting [place] to [facility] injection.  If there is a problem, please call pt

## 2017-08-26 NOTE — Telephone Encounter (Signed)
°  New Prob  Requesting 90 day supply prescription of Repatha [facility]. Please call.

## 2017-09-30 ENCOUNTER — Other Ambulatory Visit: Payer: Self-pay | Admitting: Internal Medicine

## 2017-09-30 DIAGNOSIS — N3946 Mixed incontinence: Secondary | ICD-10-CM | POA: Diagnosis not present

## 2017-09-30 DIAGNOSIS — Z0000 Encounter for general adult medical examination without abnormal findings: Secondary | ICD-10-CM | POA: Diagnosis not present

## 2017-09-30 DIAGNOSIS — I10 Essential (primary) hypertension: Secondary | ICD-10-CM | POA: Diagnosis not present

## 2017-09-30 DIAGNOSIS — Z1211 Encounter for screening for malignant neoplasm of colon: Secondary | ICD-10-CM | POA: Diagnosis not present

## 2017-09-30 DIAGNOSIS — Z1389 Encounter for screening for other disorder: Secondary | ICD-10-CM | POA: Diagnosis not present

## 2017-09-30 DIAGNOSIS — Z1231 Encounter for screening mammogram for malignant neoplasm of breast: Secondary | ICD-10-CM | POA: Diagnosis not present

## 2017-09-30 DIAGNOSIS — Z23 Encounter for immunization: Secondary | ICD-10-CM | POA: Diagnosis not present

## 2017-09-30 DIAGNOSIS — E7800 Pure hypercholesterolemia, unspecified: Secondary | ICD-10-CM | POA: Diagnosis not present

## 2017-09-30 DIAGNOSIS — F4321 Adjustment disorder with depressed mood: Secondary | ICD-10-CM | POA: Diagnosis not present

## 2017-09-30 DIAGNOSIS — G459 Transient cerebral ischemic attack, unspecified: Secondary | ICD-10-CM | POA: Diagnosis not present

## 2017-10-27 ENCOUNTER — Other Ambulatory Visit: Payer: Self-pay

## 2017-10-27 DIAGNOSIS — E7800 Pure hypercholesterolemia, unspecified: Secondary | ICD-10-CM | POA: Diagnosis not present

## 2017-10-27 DIAGNOSIS — G8929 Other chronic pain: Secondary | ICD-10-CM | POA: Diagnosis not present

## 2017-10-27 DIAGNOSIS — M5442 Lumbago with sciatica, left side: Secondary | ICD-10-CM | POA: Diagnosis not present

## 2017-10-27 DIAGNOSIS — M5441 Lumbago with sciatica, right side: Secondary | ICD-10-CM | POA: Diagnosis not present

## 2017-10-27 DIAGNOSIS — M542 Cervicalgia: Secondary | ICD-10-CM | POA: Diagnosis not present

## 2017-10-28 ENCOUNTER — Other Ambulatory Visit: Payer: Self-pay | Admitting: Specialist

## 2017-10-28 DIAGNOSIS — M542 Cervicalgia: Secondary | ICD-10-CM

## 2017-10-28 DIAGNOSIS — M5137 Other intervertebral disc degeneration, lumbosacral region: Secondary | ICD-10-CM

## 2017-10-28 LAB — LIPID PANEL
CHOL/HDL RATIO: 3.2 ratio (ref 0.0–4.4)
Cholesterol, Total: 198 mg/dL (ref 100–199)
HDL: 61 mg/dL (ref >39)
LDL CALC: 99 mg/dL (ref 0–99)
Triglycerides: 190 mg/dL — ABNORMAL HIGH (ref 0–149)
VLDL CHOLESTEROL CAL: 38 mg/dL (ref 5–40)

## 2017-10-29 ENCOUNTER — Ambulatory Visit
Admission: RE | Admit: 2017-10-29 | Discharge: 2017-10-29 | Disposition: A | Discharge status: Home / Self Care | Payer: Medicare Other | Source: Ambulatory Visit | Attending: Internal Medicine | Admitting: Internal Medicine

## 2017-10-29 DIAGNOSIS — Z1231 Encounter for screening mammogram for malignant neoplasm of breast: Secondary | ICD-10-CM | POA: Diagnosis not present

## 2017-11-11 DIAGNOSIS — E669 Obesity, unspecified: Secondary | ICD-10-CM | POA: Diagnosis not present

## 2017-11-11 DIAGNOSIS — Z1211 Encounter for screening for malignant neoplasm of colon: Secondary | ICD-10-CM | POA: Diagnosis not present

## 2017-11-11 DIAGNOSIS — K649 Unspecified hemorrhoids: Secondary | ICD-10-CM | POA: Diagnosis not present

## 2017-11-11 DIAGNOSIS — Z86010 Personal history of colonic polyps: Secondary | ICD-10-CM | POA: Diagnosis not present

## 2017-11-17 DIAGNOSIS — R351 Nocturia: Secondary | ICD-10-CM | POA: Diagnosis not present

## 2017-11-17 DIAGNOSIS — N3946 Mixed incontinence: Secondary | ICD-10-CM | POA: Diagnosis not present

## 2017-11-17 DIAGNOSIS — R350 Frequency of micturition: Secondary | ICD-10-CM | POA: Diagnosis not present

## 2017-11-22 ENCOUNTER — Ambulatory Visit
Admission: RE | Admit: 2017-11-22 | Discharge: 2017-11-22 | Disposition: A | Discharge status: Home / Self Care | Payer: Medicare Other | Source: Ambulatory Visit | Attending: Specialist | Admitting: Specialist

## 2017-11-22 DIAGNOSIS — M5137 Other intervertebral disc degeneration, lumbosacral region: Secondary | ICD-10-CM

## 2017-11-22 DIAGNOSIS — M542 Cervicalgia: Secondary | ICD-10-CM

## 2017-11-22 DIAGNOSIS — M48061 Spinal stenosis, lumbar region without neurogenic claudication: Secondary | ICD-10-CM | POA: Diagnosis not present

## 2017-11-22 DIAGNOSIS — M4802 Spinal stenosis, cervical region: Secondary | ICD-10-CM | POA: Diagnosis not present

## 2017-12-01 DIAGNOSIS — M48061 Spinal stenosis, lumbar region without neurogenic claudication: Secondary | ICD-10-CM | POA: Diagnosis not present

## 2018-01-28 ENCOUNTER — Ambulatory Visit (INDEPENDENT_AMBULATORY_CARE_PROVIDER_SITE_OTHER): Payer: Medicare Other | Admitting: Cardiovascular Disease

## 2018-01-28 ENCOUNTER — Encounter: Payer: Self-pay | Admitting: Cardiovascular Disease

## 2018-01-28 VITALS — BP 108/60 | HR 58 | Ht 60.0 in | Wt 167.8 lb

## 2018-01-28 DIAGNOSIS — E7800 Pure hypercholesterolemia, unspecified: Secondary | ICD-10-CM | POA: Diagnosis not present

## 2018-01-28 DIAGNOSIS — G459 Transient cerebral ischemic attack, unspecified: Secondary | ICD-10-CM

## 2018-01-28 DIAGNOSIS — Z5181 Encounter for therapeutic drug level monitoring: Secondary | ICD-10-CM | POA: Diagnosis not present

## 2018-01-28 NOTE — Progress Notes (Signed)
Cardiology Office Note   Date:  01/28/2018   ID:  [name], DOB Aug 26, 1946, MRN [id number]  PCP:  Lanice Shirts, MD  Cardiologist:   Skeet Latch, MD   Chief Complaint  Patient presents with  . Follow-up     History of Present Illness: [name] is a 72 y.o. female with familial hyperlipidemia, hypertension, TIA, Graves disease, and depression who presents for follow up.  [name] reports several years of hyperlipidemia. She has previously tried atorvastatin and rosuvastatin but developed chest pain and shortness of breath with these medications. During that time she underwent cardiac catheterization in 2011 that reportedly showed mild CAD. She was also noted to have a murmur in 2014 and had an echo that was reportedly normal.  She was started on pravastatin 5mg  three times per week.  She followed up with our pharmacist and was tolerating it well but her lipids were not controlled so it was increased and she developed myalgias. She was then started on Repatha and has been tolerating it well.    Since her last appointment [name] has been struggling with leg pain that has been attributed to her spinal stenosis.  She was prescribed gabapentin but hasn't tried it yet.  She has been feeling well and has no other complaints.  She started an exercise class at the [facility] 3 days per week.  She does water aerobics for people with arthritis.  She has no chest pain or shortness of breath with this activity.  She denies lower extremity edema, orthopnea or PND.  Repatha injections are going well.    Past Medical History:  Diagnosis Date  . Colonoscopy causing post-procedural bleeding   . Endometriosis   . Hyperlipidemia   . Myalgia 11/20/2016  . Obesity 08/25/2016  . Shortness of breath 07/21/2017  . Thyroid disease   . TIA (transient ischemic attack) 07/21/2017    Past Surgical History:  Procedure Laterality Date  . CARDIAC CATHETERIZATION       Current  Outpatient Medications  Medication Sig Dispense Refill  . amLODipine (NORVASC) 5 MG tablet Take 1 tablet (5 mg total) by mouth daily. 90 tablet 3  . Evolocumab (REPATHA SURECLICK) 417 MG/ML SOAJ Inject 140 mg into the skin every 14 (fourteen) days. 6 pen 3  . levothyroxine (SYNTHROID, LEVOTHROID) 88 MCG tablet Take 88 mcg by mouth daily before breakfast.    . sertraline (ZOLOFT) 25 MG tablet Take 25 mg by mouth daily.     No current facility-administered medications for this visit.     Allergies:   Atorvastatin; Codeine; Demerol [meperidine]; Penicillins; Pravastatin; and Rosuvastatin    Social History:  The patient  reports that she has quit smoking. She smoked 1.00 pack per day. she has never used smokeless tobacco. She reports that she drinks alcohol. She reports that she does not use drugs.   Family History:  The patient's  family history includes CAD in her father, mother, and sister; Dementia in her brother and mother; Kidney disease in her brother; Lymphoma in her sister.    ROS:  Please see the history of present illness.  Otherwise, review of systems are positive for tingling in arms and legs.   All other systems are reviewed and negative.    PHYSICAL EXAM: VS:  BP 108/60   Pulse (!) 58   Ht 5' (1.524 m)   Wt 167 lb 12.8 oz (76.1 kg)   BMI 32.77 kg/m  , BMI Body mass  index is 32.77 kg/m. GENERAL:  Well appearing HEENT: Pupils equal round and reactive, fundi not visualized, oral mucosa unremarkable NECK:  No jugular venous distention, waveform within normal limits, carotid upstroke brisk and symmetric, no bruits  LUNGS:  Clear to auscultation bilaterally HEART:  RRR.  PMI not displaced or sustained,S1 and S2 within normal limits, no S3, no S4, no clicks, no rubs, no murmurs ABD:  Flat, positive bowel sounds normal in frequency in pitch, no bruits, no rebound, no guarding, no midline pulsatile mass, no hepatomegaly, no splenomegaly EXT:  2 plus pulses throughout, no edema,  no cyanosis no clubbing SKIN:  No rashes no nodules NEURO:  Cranial nerves II through XII grossly intact, motor grossly intact throughout PSYCH:  Cognitively intact, oriented to person place and time   EKG:  EKG is ordered today. The ekg ordered 08/25/16 demonstrates sinus rhythm. Rate 60 bpm. Nonspecific T wave abnormalities. 07/21/17: Sinus rhythm.  Rate 60 bpm.  01/28/18: Sinus bradycardia.  Rate 58 bpm.    Recent Labs: No results found for requested labs within last 8760 hours.    Lipid Panel    Component Value Date/Time   CHOL 198 10/27/2017 1210   TRIG 190 (H) 10/27/2017 1210   HDL 61 10/27/2017 1210   CHOLHDL 3.2 10/27/2017 1210   CHOLHDL 5.3 (H) 12/28/2016 0924   VLDL 49 (H) 12/28/2016 0924   LDLCALC 99 10/27/2017 1210   06/03/16:  WBC 5.2, hemoglobin of 0.4, hematocrit 37.4, platelets 222 Sodium 140, potassium 4.3 BUN 20, creatinine 0.96 AST 24, ALT 22 TSH 0.37 Total cholesterol 341, triglycerides 334, HDL 58, LDL 217  Wt Readings from Last 3 Encounters:  01/28/18 167 lb 12.8 oz (76.1 kg)  07/21/17 172 lb (78 kg)  01/06/17 170 lb 6.4 oz (77.3 kg)      ASSESSMENT AND PLAN:  # Hyperlipidemia: # Statin intolerance Tolerating Repatha.  Repeat lipids and CMP 10/2018.   # Shortness of breath: Resolved.  Continue exercise.   # TIA: Continue aspirin.  Switch aspirin to 81mg  .  Continue Repatha.    # Dysphagia: # Odynophagia: Resolved.  Current medicines are reviewed at length with the patient today.  The patient does not have concerns regarding medicines.  The following changes have been made:  Switch aspirin to 81mg .   Labs/ tests ordered today include:   No orders of the defined types were placed in this encounter.    Disposition:   FU with Hyacinth Marcelli C. Oval Linsey, MD, [facility] in 9 months.    Signed, Mariell Nester C. Oval Linsey, MD, [facility]  01/28/2018 10:32 AM    [place]

## 2018-01-28 NOTE — Patient Instructions (Signed)
Medication Instructions:  DECREASE YOUR ASPIRIN TO 81 MG DAILY   Labwork: FASTING LP/CMET ABOUT 1 WEEK PRIOR TO YOUR FOLLOW UP IN December   Testing/Procedures: NONE  Follow-Up: Your physician recommends that you schedule a follow-up appointment in: IN December   If you need a refill on your cardiac medications before your next appointment, please call your pharmacy.

## 2018-01-28 NOTE — Addendum Note (Signed)
Addended by: Alvina Filbert B on: 01/28/2018 11:56 AM   Modules accepted: Orders

## 2018-03-08 DIAGNOSIS — M47812 Spondylosis without myelopathy or radiculopathy, cervical region: Secondary | ICD-10-CM | POA: Diagnosis not present

## 2018-03-08 DIAGNOSIS — M47816 Spondylosis without myelopathy or radiculopathy, lumbar region: Secondary | ICD-10-CM | POA: Diagnosis not present

## 2018-03-08 DIAGNOSIS — M546 Pain in thoracic spine: Secondary | ICD-10-CM | POA: Diagnosis not present

## 2018-03-08 DIAGNOSIS — M542 Cervicalgia: Secondary | ICD-10-CM | POA: Diagnosis not present

## 2018-03-08 DIAGNOSIS — G5603 Carpal tunnel syndrome, bilateral upper limbs: Secondary | ICD-10-CM | POA: Diagnosis not present

## 2018-03-08 DIAGNOSIS — M48062 Spinal stenosis, lumbar region with neurogenic claudication: Secondary | ICD-10-CM | POA: Diagnosis not present

## 2018-03-08 DIAGNOSIS — M5136 Other intervertebral disc degeneration, lumbar region: Secondary | ICD-10-CM | POA: Diagnosis not present

## 2018-03-08 DIAGNOSIS — M5030 Other cervical disc degeneration, unspecified cervical region: Secondary | ICD-10-CM | POA: Diagnosis not present

## 2018-03-08 DIAGNOSIS — Z6832 Body mass index (BMI) 32.0-32.9, adult: Secondary | ICD-10-CM | POA: Diagnosis not present

## 2018-03-08 DIAGNOSIS — M4316 Spondylolisthesis, lumbar region: Secondary | ICD-10-CM | POA: Diagnosis not present

## 2018-03-08 DIAGNOSIS — M5416 Radiculopathy, lumbar region: Secondary | ICD-10-CM | POA: Diagnosis not present

## 2018-03-08 DIAGNOSIS — M549 Dorsalgia, unspecified: Secondary | ICD-10-CM | POA: Diagnosis not present

## 2018-03-31 DIAGNOSIS — I10 Essential (primary) hypertension: Secondary | ICD-10-CM | POA: Diagnosis not present

## 2018-03-31 DIAGNOSIS — M48061 Spinal stenosis, lumbar region without neurogenic claudication: Secondary | ICD-10-CM | POA: Diagnosis not present

## 2018-03-31 DIAGNOSIS — G459 Transient cerebral ischemic attack, unspecified: Secondary | ICD-10-CM | POA: Diagnosis not present

## 2018-03-31 DIAGNOSIS — E7800 Pure hypercholesterolemia, unspecified: Secondary | ICD-10-CM | POA: Diagnosis not present

## 2018-05-09 DIAGNOSIS — I10 Essential (primary) hypertension: Secondary | ICD-10-CM | POA: Diagnosis not present

## 2018-05-09 DIAGNOSIS — M5416 Radiculopathy, lumbar region: Secondary | ICD-10-CM | POA: Diagnosis not present

## 2018-05-09 DIAGNOSIS — M5136 Other intervertebral disc degeneration, lumbar region: Secondary | ICD-10-CM | POA: Diagnosis not present

## 2018-05-09 DIAGNOSIS — G5603 Carpal tunnel syndrome, bilateral upper limbs: Secondary | ICD-10-CM | POA: Diagnosis not present

## 2018-05-09 DIAGNOSIS — M47816 Spondylosis without myelopathy or radiculopathy, lumbar region: Secondary | ICD-10-CM | POA: Diagnosis not present

## 2018-05-09 DIAGNOSIS — Z6832 Body mass index (BMI) 32.0-32.9, adult: Secondary | ICD-10-CM | POA: Diagnosis not present

## 2018-05-09 DIAGNOSIS — M48062 Spinal stenosis, lumbar region with neurogenic claudication: Secondary | ICD-10-CM | POA: Diagnosis not present

## 2018-06-01 ENCOUNTER — Telehealth: Payer: Self-pay | Admitting: Cardiovascular Disease

## 2018-06-01 NOTE — Telephone Encounter (Signed)
New Message       *STAT* If patient is at the pharmacy, call can be transferred to refill team.   1. Which medications need to be refilled? (please list name of each medication and dose if known) Repatha  2. Which pharmacy/location (including street and city if local pharmacy) is medication to be sent to?[place] [facility]  3. Do they need a 30 day or 90 day supply? [place]

## 2018-06-02 MED ORDER — EVOLOCUMAB 140 MG/ML ~~LOC~~ SOAJ: 140.0000 mg | SUBCUTANEOUS | 3 refills | Status: DC

## 2018-06-02 NOTE — Telephone Encounter (Signed)
New Rx sent to [facility] as requested by patient

## 2018-06-08 ENCOUNTER — Telehealth: Payer: Self-pay | Admitting: Cardiovascular Disease

## 2018-06-08 NOTE — Telephone Encounter (Signed)
New Message:       *STAT* If patient is at the pharmacy, call can be transferred to refill team.   1. Which medications need to be refilled? (please list name of each medication and dose if known) Evolocumab (REPATHA SURECLICK) 656 MG/ML SOAJ  2. Which pharmacy/location (including street and city if local pharmacy) is medication to be sent to?[facility] [place]  3. Do they need a 30 day or 90 day supply? 1 yr supply     Pt states they asked [place] to use case # [id number] and we can call the [phone]. Pt states she is down to one shot and is getting ready to go out of town.

## 2018-06-08 NOTE — Telephone Encounter (Signed)
Rx sent and on HOLD until prior-authorization completed

## 2018-06-09 NOTE — Telephone Encounter (Signed)
Prior authorization faxed to Insurance 06/09/18

## 2018-06-10 NOTE — Telephone Encounter (Signed)
Patient aware that PA in in process.  Will call her once re-authorized.

## 2018-09-06 DIAGNOSIS — M48062 Spinal stenosis, lumbar region with neurogenic claudication: Secondary | ICD-10-CM | POA: Diagnosis not present

## 2018-09-06 DIAGNOSIS — M5136 Other intervertebral disc degeneration, lumbar region: Secondary | ICD-10-CM | POA: Diagnosis not present

## 2018-09-06 DIAGNOSIS — M47816 Spondylosis without myelopathy or radiculopathy, lumbar region: Secondary | ICD-10-CM | POA: Diagnosis not present

## 2018-09-06 DIAGNOSIS — M5416 Radiculopathy, lumbar region: Secondary | ICD-10-CM | POA: Diagnosis not present

## 2018-09-18 ENCOUNTER — Other Ambulatory Visit: Payer: Self-pay | Admitting: Cardiovascular Disease

## 2018-10-05 ENCOUNTER — Other Ambulatory Visit: Payer: Self-pay | Admitting: Internal Medicine

## 2018-10-05 DIAGNOSIS — E039 Hypothyroidism, unspecified: Secondary | ICD-10-CM | POA: Diagnosis not present

## 2018-10-05 DIAGNOSIS — E7800 Pure hypercholesterolemia, unspecified: Secondary | ICD-10-CM | POA: Diagnosis not present

## 2018-10-05 DIAGNOSIS — Z1231 Encounter for screening mammogram for malignant neoplasm of breast: Secondary | ICD-10-CM

## 2018-10-05 DIAGNOSIS — Z0000 Encounter for general adult medical examination without abnormal findings: Secondary | ICD-10-CM | POA: Diagnosis not present

## 2018-10-05 DIAGNOSIS — E559 Vitamin D deficiency, unspecified: Secondary | ICD-10-CM | POA: Diagnosis not present

## 2018-10-05 DIAGNOSIS — Z23 Encounter for immunization: Secondary | ICD-10-CM | POA: Diagnosis not present

## 2018-10-05 DIAGNOSIS — E2839 Other primary ovarian failure: Secondary | ICD-10-CM

## 2018-10-24 DIAGNOSIS — E7800 Pure hypercholesterolemia, unspecified: Secondary | ICD-10-CM | POA: Diagnosis not present

## 2018-10-24 DIAGNOSIS — Z5181 Encounter for therapeutic drug level monitoring: Secondary | ICD-10-CM | POA: Diagnosis not present

## 2018-10-24 LAB — COMPREHENSIVE METABOLIC PANEL
ALBUMIN: 3.8 g/dL (ref 3.5–4.8)
ALT: 16 IU/L (ref 0–32)
AST: 21 IU/L (ref 0–40)
Albumin/Globulin Ratio: 1.4 (ref 1.2–2.2)
Alkaline Phosphatase: 105 IU/L (ref 39–117)
BILIRUBIN TOTAL: 0.2 mg/dL (ref 0.0–1.2)
BUN/Creatinine Ratio: 17 (ref 12–28)
BUN: 17 mg/dL (ref 8–27)
CHLORIDE: 105 mmol/L (ref 96–106)
CO2: 23 mmol/L (ref 20–29)
CREATININE: 0.99 mg/dL (ref 0.57–1.00)
Calcium: 9.4 mg/dL (ref 8.7–10.3)
GFR calc non Af Amer: 56 mL/min/{1.73_m2} — ABNORMAL LOW (ref >59)
GFR, EST AFRICAN AMERICAN: 65 mL/min/{1.73_m2} (ref >59)
GLUCOSE: 89 mg/dL (ref 65–99)
Globulin, Total: 2.7 g/dL (ref 1.5–4.5)
Potassium: 4.5 mmol/L (ref 3.5–5.2)
Sodium: 143 mmol/L (ref 134–144)
TOTAL PROTEIN: 6.5 g/dL (ref 6.0–8.5)

## 2018-10-24 LAB — LIPID PANEL
Chol/HDL Ratio: 3 ratio (ref 0.0–4.4)
Cholesterol, Total: 195 mg/dL (ref 100–199)
HDL: 66 mg/dL (ref >39)
LDL Calculated: 91 mg/dL (ref 0–99)
Triglycerides: 190 mg/dL — ABNORMAL HIGH (ref 0–149)
VLDL CHOLESTEROL CAL: 38 mg/dL (ref 5–40)

## 2018-10-25 ENCOUNTER — Ambulatory Visit: Payer: Medicare Other | Admitting: Cardiovascular Disease

## 2018-10-26 ENCOUNTER — Encounter: Payer: Self-pay | Admitting: *Deleted

## 2018-11-10 ENCOUNTER — Encounter: Payer: Self-pay | Admitting: Cardiovascular Disease

## 2018-11-10 ENCOUNTER — Ambulatory Visit (INDEPENDENT_AMBULATORY_CARE_PROVIDER_SITE_OTHER): Payer: Medicare Other | Admitting: Cardiovascular Disease

## 2018-11-10 VITALS — BP 122/70 | HR 58 | Ht 60.0 in | Wt 167.6 lb

## 2018-11-10 DIAGNOSIS — E7800 Pure hypercholesterolemia, unspecified: Secondary | ICD-10-CM

## 2018-11-10 DIAGNOSIS — G459 Transient cerebral ischemic attack, unspecified: Secondary | ICD-10-CM | POA: Diagnosis not present

## 2018-11-10 DIAGNOSIS — R0602 Shortness of breath: Secondary | ICD-10-CM

## 2018-11-10 NOTE — Patient Instructions (Signed)
Medication Instructions:  NO CHANGE If you need a refill on your cardiac medications before your next appointment, please call your pharmacy.   Lab work: If you have labs (blood work) drawn today and your tests are completely normal, you will receive your results only by: Marland Kitchen MyChart Message (if you have MyChart) OR . A paper copy in the mail If you have any lab test that is abnormal or we need to change your treatment, we will call you to review the results.  Follow-Up: At [facility], you and your health needs are our priority.  As part of our continuing mission to provide you with exceptional heart care, we have created designated Provider Care Teams.  These Care Teams include your primary Cardiologist (physician) and Advanced Practice Providers (APPs -  Physician Assistants and Nurse Practitioners) who all work together to provide you with the care you need, when you need it. You will need a follow up appointment in 12 months.  Please call our office 2 months in advance to schedule this appointment.  You may see TIFFANY Waldo MD or one of the following Advanced Practice Providers on your designated Care Team:   Kerin Ransom, PA-C Roby Lofts, [place] . Sande Rives, PA-C

## 2018-11-10 NOTE — Progress Notes (Signed)
Cardiology Office Note   Date:  11/10/2018   ID:  [name], DOB [birth year], MRN [id number]  PCP:  Lanice Shirts, MD  Cardiologist:   Skeet Latch, MD   No chief complaint on file.    History of Present Illness: [name] is a 72 y.o. female with familial hyperlipidemia, hypertension, TIA, Graves disease, and depression who presents for follow up.  [name] reports several years of hyperlipidemia. She has previously tried atorvastatin and rosuvastatin but developed chest pain and shortness of breath with these medications. During that time she underwent cardiac catheterization in 2011 that reportedly showed mild CAD. She was also noted to have a murmur in 2014 and had an echo that was reportedly normal.  She was started on pravastatin 5mg  three times per week.  She followed up with our pharmacist and was tolerating it well but her lipids were not controlled so it was increased and she developed myalgias. She was then started on Repatha and has been tolerating it well.    Since her last appointment [name] has been doing well.  She hasn't had any chest pain and her breathing has been stable.  She has struggled with her diet a bit lately.  She noted some swelling that occurred after taking vitamin D so she stopped it.  In retrospect she also notes that she was eating more ham and biscuits during that time.  She has been feeling tired lately.  She goes to bed tired and still feels tired when she wakes up.  She often wants to take a nap during the day.  She has difficulty concentrating and has been feeling more depressed.  She cries easily.  She dos not snore.    Past Medical History:  Diagnosis Date  . Colonoscopy causing post-procedural bleeding   . Endometriosis   . Hyperlipidemia   . Myalgia 11/20/2016  . Obesity 08/25/2016  . Shortness of breath 07/21/2017  . Thyroid disease   . TIA (transient ischemic attack) 07/21/2017    Past Surgical History:    Procedure Laterality Date  . CARDIAC CATHETERIZATION       Current Outpatient Medications  Medication Sig Dispense Refill  . amLODipine (NORVASC) 5 MG tablet TAKE 1 TABLET BY MOUTH DAILY 90 tablet 2  . Evolocumab (REPATHA SURECLICK) 629 MG/ML SOAJ Inject 140 mg into the skin every 14 (fourteen) days. 6 pen 3  . levothyroxine (SYNTHROID, LEVOTHROID) 88 MCG tablet Take 88 mcg by mouth daily before breakfast.    . sertraline (ZOLOFT) 25 MG tablet Take 25 mg by mouth daily.     No current facility-administered medications for this visit.     Allergies:   Atorvastatin; Codeine; Demerol [meperidine]; Penicillins; Pravastatin; and Rosuvastatin    Social History:  The patient  reports that she has quit smoking. She smoked 1.00 pack per day. She has never used smokeless tobacco. She reports current alcohol use. She reports that she does not use drugs.   Family History:  The patient's  family history includes CAD in her father, mother, and sister; Dementia in her brother and mother; Kidney disease in her brother; Lymphoma in her sister.    ROS:  Please see the history of present illness.  Otherwise, review of systems are positive for tingling in arms and legs.   All other systems are reviewed and negative.    PHYSICAL EXAM: VS:  BP 122/70   Pulse (!) 58   Ht 5' (1.524 m)  Wt 167 lb 9.6 oz (76 kg)   BMI 32.73 kg/m  , BMI Body mass index is 32.73 kg/m. GENERAL:  Well appearing HEENT: Pupils equal round and reactive, fundi not visualized, oral mucosa unremarkable NECK:  No jugular venous distention, waveform within normal limits, carotid upstroke brisk and symmetric, no bruits LUNGS:  Clear to auscultation bilaterally HEART:  RRR.  PMI not displaced or sustained,S1 and S2 within normal limits, no S3, no S4, no clicks, no rubs, no murmurs ABD:  Flat, positive bowel sounds normal in frequency in pitch, no bruits, no rebound, no guarding, no midline pulsatile mass, no hepatomegaly, no  splenomegaly EXT:  2 plus pulses throughout, no edema, no cyanosis no clubbing SKIN:  No rashes no nodules NEURO:  Cranial nerves II through XII grossly intact, motor grossly intact throughout PSYCH:  Cognitively intact, oriented to person place and time   EKG:  EKG is ordered today. The ekg ordered 08/25/16 demonstrates sinus rhythm. Rate 60 bpm. Nonspecific T wave abnormalities. 07/21/17: Sinus rhythm.  Rate 60 bpm.  01/28/18: Sinus bradycardia.  Rate 58 bpm.   11/10/18: Sinus bradycardia.  Rate 58 bmp.    Recent Labs: 10/24/2018: ALT 16; BUN 17; Creatinine, Ser 0.99; Potassium 4.5; Sodium 143    Lipid Panel    Component Value Date/Time   CHOL 195 10/24/2018 0827   TRIG 190 (H) 10/24/2018 0827   HDL 66 10/24/2018 0827   CHOLHDL 3.0 10/24/2018 0827   CHOLHDL 5.3 (H) 12/28/2016 0924   VLDL 49 (H) 12/28/2016 0924   LDLCALC 91 10/24/2018 0827   06/03/16:  WBC 5.2, hemoglobin of 0.4, hematocrit 37.4, platelets 222 Sodium 140, potassium 4.3 BUN 20, creatinine 0.96 AST 24, ALT 22 TSH 0.37 Total cholesterol 341, triglycerides 334, HDL 58, LDL 217  Wt Readings from Last 3 Encounters:  11/10/18 167 lb 9.6 oz (76 kg)  01/28/18 167 lb 12.8 oz (76.1 kg)  07/21/17 172 lb (78 kg)      ASSESSMENT AND PLAN:  # Hyperlipidemia: # Statin intolerance Tolerating Repatha.  LDL was 91 on 10/2018.  # Shortness of breath: Resolved.  Continue exercise.   # TIA: Continue aspirin.  Switch aspirin to 81mg  .  Continue Repatha.    # Dysphagia: # Odynophagia: Resolved.  # Fatigue: Likely attributed to worsening depression.  Her PCP already told her it was OK to increase Zoloft to 50mg  as needed.    Current medicines are reviewed at length with the patient today.  The patient does not have concerns regarding medicines.  The following changes have been made:  Switch aspirin to 81mg .   Labs/ tests ordered today include:   Orders Placed This Encounter  Procedures  . EKG 12-Lead      Disposition:   FU with Lion Fernandez C. Oval Linsey, MD, [facility] in 1 year.    Signed, Dawon Troop C. Oval Linsey, MD, [facility]  11/10/2018 3:52 PM    [facility]

## 2019-01-08 DIAGNOSIS — H9202 Otalgia, left ear: Secondary | ICD-10-CM | POA: Diagnosis not present

## 2019-01-10 DIAGNOSIS — J0100 Acute maxillary sinusitis, unspecified: Secondary | ICD-10-CM | POA: Diagnosis not present

## 2019-01-10 DIAGNOSIS — H6592 Unspecified nonsuppurative otitis media, left ear: Secondary | ICD-10-CM | POA: Diagnosis not present

## 2019-01-12 DIAGNOSIS — H9202 Otalgia, left ear: Secondary | ICD-10-CM | POA: Diagnosis not present

## 2019-01-12 DIAGNOSIS — H903 Sensorineural hearing loss, bilateral: Secondary | ICD-10-CM | POA: Diagnosis not present

## 2019-02-02 DIAGNOSIS — S92001A Unspecified fracture of right calcaneus, initial encounter for closed fracture: Secondary | ICD-10-CM | POA: Diagnosis not present

## 2019-02-02 DIAGNOSIS — M722 Plantar fascial fibromatosis: Secondary | ICD-10-CM | POA: Diagnosis not present

## 2019-02-09 DIAGNOSIS — S92001D Unspecified fracture of right calcaneus, subsequent encounter for fracture with routine healing: Secondary | ICD-10-CM | POA: Diagnosis not present

## 2019-02-23 DIAGNOSIS — S92001D Unspecified fracture of right calcaneus, subsequent encounter for fracture with routine healing: Secondary | ICD-10-CM | POA: Diagnosis not present

## 2019-02-23 DIAGNOSIS — M67372 Transient synovitis, left ankle and foot: Secondary | ICD-10-CM | POA: Diagnosis not present

## 2019-03-08 ENCOUNTER — Ambulatory Visit: Payer: Medicare Other

## 2019-03-08 ENCOUNTER — Other Ambulatory Visit: Payer: Medicare Other

## 2019-03-16 DIAGNOSIS — S92001D Unspecified fracture of right calcaneus, subsequent encounter for fracture with routine healing: Secondary | ICD-10-CM | POA: Diagnosis not present

## 2019-03-22 DIAGNOSIS — E7800 Pure hypercholesterolemia, unspecified: Secondary | ICD-10-CM | POA: Diagnosis not present

## 2019-03-22 DIAGNOSIS — Z13820 Encounter for screening for osteoporosis: Secondary | ICD-10-CM | POA: Diagnosis not present

## 2019-03-22 DIAGNOSIS — I10 Essential (primary) hypertension: Secondary | ICD-10-CM | POA: Diagnosis not present

## 2019-03-22 DIAGNOSIS — Z1239 Encounter for other screening for malignant neoplasm of breast: Secondary | ICD-10-CM | POA: Diagnosis not present

## 2019-03-22 DIAGNOSIS — G459 Transient cerebral ischemic attack, unspecified: Secondary | ICD-10-CM | POA: Diagnosis not present

## 2019-04-26 ENCOUNTER — Other Ambulatory Visit: Payer: Self-pay

## 2019-04-26 ENCOUNTER — Ambulatory Visit
Admission: RE | Admit: 2019-04-26 | Discharge: 2019-04-26 | Disposition: A | Discharge status: Home / Self Care | Payer: Medicare Other | Source: Ambulatory Visit | Attending: Internal Medicine | Admitting: Internal Medicine

## 2019-04-26 DIAGNOSIS — M85852 Other specified disorders of bone density and structure, left thigh: Secondary | ICD-10-CM | POA: Diagnosis not present

## 2019-04-26 DIAGNOSIS — Z780 Asymptomatic menopausal state: Secondary | ICD-10-CM | POA: Diagnosis not present

## 2019-04-26 DIAGNOSIS — E2839 Other primary ovarian failure: Secondary | ICD-10-CM

## 2019-04-26 DIAGNOSIS — Z1231 Encounter for screening mammogram for malignant neoplasm of breast: Secondary | ICD-10-CM

## 2019-05-23 DIAGNOSIS — G459 Transient cerebral ischemic attack, unspecified: Secondary | ICD-10-CM | POA: Diagnosis not present

## 2019-05-23 DIAGNOSIS — F329 Major depressive disorder, single episode, unspecified: Secondary | ICD-10-CM | POA: Diagnosis not present

## 2019-05-23 DIAGNOSIS — F5101 Primary insomnia: Secondary | ICD-10-CM | POA: Diagnosis not present

## 2019-05-23 DIAGNOSIS — E7800 Pure hypercholesterolemia, unspecified: Secondary | ICD-10-CM | POA: Diagnosis not present

## 2019-05-23 DIAGNOSIS — I10 Essential (primary) hypertension: Secondary | ICD-10-CM | POA: Diagnosis not present

## 2019-05-27 DIAGNOSIS — Z20828 Contact with and (suspected) exposure to other viral communicable diseases: Secondary | ICD-10-CM | POA: Diagnosis not present

## 2019-08-17 ENCOUNTER — Other Ambulatory Visit: Payer: Self-pay | Admitting: Cardiovascular Disease

## 2019-08-17 NOTE — Telephone Encounter (Signed)
 *  STAT* If patient is at the pharmacy, call can be transferred to refill team.   1. Which medications need to be refilled? (please list name of each medication and dose if known)  Evolocumab (REPATHA SURECLICK) [id number] MG/ML SOAJ  2. Which pharmacy/location (including street and city if local pharmacy) is medication to be sent to? [facility]  3. Do they need a 30 day or 90 day supply? Year supply if possible

## 2019-08-18 MED ORDER — REPATHA SURECLICK 140 MG/ML ~~LOC~~ SOAJ: 140.0000 mg | SUBCUTANEOUS | 3 refills | Status: DC

## 2019-10-09 ENCOUNTER — Other Ambulatory Visit: Payer: Self-pay

## 2019-10-09 DIAGNOSIS — Z20822 Contact with and (suspected) exposure to covid-19: Secondary | ICD-10-CM

## 2019-10-10 LAB — NOVEL CORONAVIRUS, NAA: SARS-CoV-2, NAA: NOT DETECTED

## 2019-11-15 ENCOUNTER — Other Ambulatory Visit: Payer: Self-pay

## 2019-11-15 ENCOUNTER — Ambulatory Visit (INDEPENDENT_AMBULATORY_CARE_PROVIDER_SITE_OTHER): Payer: Medicare Other | Admitting: Cardiovascular Disease

## 2019-11-15 ENCOUNTER — Encounter: Payer: Self-pay | Admitting: Cardiovascular Disease

## 2019-11-15 VITALS — BP 118/60 | HR 61 | Ht 60.0 in | Wt 174.2 lb

## 2019-11-15 DIAGNOSIS — R0602 Shortness of breath: Secondary | ICD-10-CM

## 2019-11-15 DIAGNOSIS — E7800 Pure hypercholesterolemia, unspecified: Secondary | ICD-10-CM | POA: Diagnosis not present

## 2019-11-15 DIAGNOSIS — R072 Precordial pain: Secondary | ICD-10-CM | POA: Diagnosis not present

## 2019-11-15 MED ORDER — METOPROLOL TARTRATE 100 MG PO TABS: 100.0000 mg | ORAL_TABLET | Freq: Once | ORAL | 0 refills | Status: DC

## 2019-11-15 MED ORDER — REPATHA SURECLICK 140 MG/ML ~~LOC~~ SOAJ: 140.0000 mg | SUBCUTANEOUS | 3 refills | Status: DC

## 2019-11-15 MED ORDER — METOPROLOL TARTRATE 100 MG PO TABS: 100.0000 mg | ORAL_TABLET | Freq: Two times a day (BID) | ORAL | 0 refills | Status: DC

## 2019-11-15 NOTE — Patient Instructions (Addendum)
Medication Instructions:  NO CHANGES *If you need a refill on your cardiac medications before your next appointment, please call your pharmacy*  Lab Work: Your physician recommends that you return for lab work one week before your CTA.  If you have labs (blood work) drawn today and your tests are completely normal, you will receive your results only by: Marland Kitchen MyChart Message (if you have MyChart) OR . A paper copy in the mail If you have any lab test that is abnormal or we need to change your treatment, we will call you to review the results.  Testing/Procedures: Non-Cardiac CT Angiography (CTA), is a special type of CT scan that uses a computer to produce multi-dimensional views of major blood vessels throughout the body. In CT angiography, a contrast material is injected through an IV to help visualize the blood vessels [facility]  Follow-Up: At [place] [facility], you and your health needs are our priority.  As part of our continuing mission to provide you with exceptional heart care, we have created designated Provider Care Teams.  These Care Teams include your primary Cardiologist (physician) and Advanced Practice Providers (APPs -  Physician Assistants and Nurse Practitioners) who all work together to provide you with the care you need, when you need it.  Your next appointment:   1 year(s)  The format for your next appointment:   In Person  Provider:   Skeet Latch, MD  Your cardiac CT will be scheduled at one of the below locations:   [facility] [address] [place], [place] [address] [phone]   If scheduled at [facility], please arrive at the [facility] main entrance of [facility] 30-45 minutes prior to test start time. Proceed to the [facility] Radiology Department (first floor) to check-in and test prep.  Please follow these instructions carefully (unless otherwise directed):  Please have Lab CMET and Lipids done one week  prior to test.  On the Night Before the Test: . Be sure to Drink plenty of water. . Do not consume any caffeinated/decaffeinated beverages or chocolate 12 hours prior to your test. . Do not take any antihistamines 12 hours prior to your test.  On the Day of the Test: . Drink plenty of water. Do not drink any water within one hour of the test. . Do not eat any food 4 hours prior to the test. . You may take your regular medications prior to the test.  . Take metoprolol (Lopressor) 100mg  two hours prior to test. . FEMALES- please wear underwire-free bra if available         After the Test: . Drink plenty of water. . After receiving IV contrast, you may experience a mild flushed feeling. This is normal. . On occasion, you may experience a mild rash up to 24 hours after the test. This is not dangerous. If this occurs, you can take Benadryl 25 mg and increase your fluid intake. . If you experience trouble breathing, this can be serious. If it is severe call 911 IMMEDIATELY. If it is mild, please call our office.   Once we have confirmed authorization from your insurance company, we will call you to set up a date and time for your test.   For non-scheduling related questions, please contact the cardiac imaging nurse navigator should you have any questions/concerns: Marchia Bond, RN Navigator Cardiac Imaging [facility] [phone] Office

## 2019-11-15 NOTE — Progress Notes (Signed)
Cardiology Office Note   Date:  11/15/2019   ID:  Korianne, Brar Oct 20, 1946, MRN [id number]  PCP:  Johna Roles, PA  Cardiologist:   Skeet Latch, MD   No chief complaint on file.    History of Present Illness: [name] is a 73 y.o. female with familial hyperlipidemia, hypertension, TIA, Graves disease, and depression who presents for follow up.  Ms. Carchi reports several years of hyperlipidemia. She has previously tried atorvastatin and rosuvastatin but developed chest pain and shortness of breath with these medications. During that time she underwent cardiac catheterization in 2011 that reportedly showed mild CAD. She was also noted to have a murmur in 2014 and had an echo that was reportedly normal.  She was started on pravastatin 5mg  three times per week.  She followed up with our pharmacist and was tolerating it well but her lipids were not controlled so it was increased and she developed myalgias. She was then started on Repatha and has been tolerating it well.    Lately Ms. Montag has been feeling very tired and is sleeping a lot.  Twice last week she woke up with severe pain in her chest and across her shoulders.  She took baby aspirin and Tums.  The pain lasted for several hours and didn't improve with sitting up.  The pain eventually went away but she then had a lot of pain across her shoulders and lower back throughout the day.  She also notices that she is out of breath more.  This was associated with nausea and shortness of breath.  This happens mostly at rest but sometimes with exertion.  She continues to have episodes of dysphagia.  She continues to walk daily..  She denies lower extremity edema, orthopnea or PND.   Past Medical History:  Diagnosis Date  . Colonoscopy causing post-procedural bleeding   . Endometriosis   . Hyperlipidemia   . Myalgia 11/20/2016  . Obesity 08/25/2016  . Shortness of breath 07/21/2017  . Thyroid disease   . TIA  (transient ischemic attack) 07/21/2017    Past Surgical History:  Procedure Laterality Date  . CARDIAC CATHETERIZATION       Current Outpatient Medications  Medication Sig Dispense Refill  . amLODipine (NORVASC) 5 MG tablet TAKE 1 TABLET BY MOUTH DAILY 90 tablet 2  . Evolocumab (REPATHA SURECLICK) XX123456 MG/ML SOAJ Inject 140 mg into the skin every 14 (fourteen) days. 6 pen 3  . levothyroxine (SYNTHROID, LEVOTHROID) 88 MCG tablet Take 88 mcg by mouth daily before breakfast.    . sertraline (ZOLOFT) 25 MG tablet Take 25 mg by mouth daily.     No current facility-administered medications for this visit.    Allergies:   Atorvastatin, Codeine, Demerol [meperidine], Penicillins, Pravastatin, and Rosuvastatin    Social History:  The patient  reports that she has quit smoking. She smoked 1.00 pack per day. She has never used smokeless tobacco. She reports current alcohol use. She reports that she does not use drugs.   Family History:  The patient's  family history includes CAD in her father, mother, and sister; Dementia in her brother and mother; Kidney disease in her brother; Lymphoma in her sister.    ROS:  Please see the history of present illness.  Otherwise, review of systems are positive for tingling in arms and legs.   All other systems are reviewed and negative.    PHYSICAL EXAM: VS:  BP (!) 142/75   Pulse 61  Ht 5' (1.524 m)   Wt 174 lb 3.2 oz (79 kg)   SpO2 96%   BMI 34.02 kg/m  , BMI Body mass index is 34.02 kg/m. GENERAL:  Well appearing HEENT: Pupils equal round and reactive, fundi not visualized, oral mucosa unremarkable NECK:  No jugular venous distention, waveform within normal limits, carotid upstroke brisk and symmetric, no bruits LUNGS:  Clear to auscultation bilaterally HEART:  RRR.  PMI not displaced or sustained,S1 and S2 within normal limits, no S3, no S4, no clicks, no rubs, no murmurs ABD:  Flat, positive bowel sounds normal in frequency in pitch, no bruits,  no rebound, no guarding, no midline pulsatile mass, no hepatomegaly, no splenomegaly EXT:  2 plus pulses throughout, no edema, no cyanosis no clubbing SKIN:  No rashes no nodules NEURO:  Cranial nerves II through XII grossly intact, motor grossly intact throughout PSYCH:  Cognitively intact, oriented to person place and time   EKG:  EKG is ordered today. The ekg ordered 08/25/16 demonstrates sinus rhythm. Rate 60 bpm. Nonspecific T wave abnormalities. 07/21/17: Sinus rhythm.  Rate 60 bpm.  01/28/18: Sinus bradycardia.  Rate 58 bpm.   11/10/18: Sinus bradycardia.  Rate 58 bmp.   11/15/19: Sinus rhythm rate 61 bpm.     Recent Labs: No results found for requested labs within last 8760 hours.    Lipid Panel    Component Value Date/Time   CHOL 195 10/24/2018 0827   TRIG 190 (H) 10/24/2018 0827   HDL 66 10/24/2018 0827   CHOLHDL 3.0 10/24/2018 0827   CHOLHDL 5.3 (H) 12/28/2016 0924   VLDL 49 (H) 12/28/2016 0924   LDLCALC 91 10/24/2018 0827   06/03/16:  WBC 5.2, hemoglobin of 0.4, hematocrit 37.4, platelets 222 Sodium 140, potassium 4.3 BUN 20, creatinine 0.96 AST 24, ALT 22 TSH 0.37 Total cholesterol 341, triglycerides 334, HDL 58, LDL 217  Wt Readings from Last 3 Encounters:  11/15/19 174 lb 3.2 oz (79 kg)  11/10/18 167 lb 9.6 oz (76 kg)  01/28/18 167 lb 12.8 oz (76.1 kg)      ASSESSMENT AND PLAN:  # Hyperlipidemia: # Statin intolerance Tolerating Repatha.  LDL was 91 on 10/2018.  Check fasting lipids/CMP.  # Shortness of breath: # Chest pain:  Symptoms seem GI related.  However she also has exertional dyspnea.  We will get a coronary CT-A to evaluate for obstructive disease.  # Hypertension:   Blood pressure was initially elevated but within normal limits on repeat.  Continue amlodipine.  # TIA: Continue aspirin.  Switch aspirin to 81mg  .  Continue Repatha.    # Dysphagia: # Odynophagia: Resolved.  # Fatigue: Likely attributed to worsening depression.  Her PCP  already told her it was OK to increase Zoloft to 50mg  as needed.    Current medicines are reviewed at length with the patient today.  The patient does not have concerns regarding medicines.  The following changes have been made: none  Labs/ tests ordered today include:   No orders of the defined types were placed in this encounter.    Disposition:   FU with Bonney Berres C. Oval Linsey, MD, [facility] in 1 year.    Signed, Faron Whitelock C. Oval Linsey, MD, [facility]  11/15/2019 10:41 AM    [place]

## 2019-11-16 ENCOUNTER — Other Ambulatory Visit: Payer: Self-pay

## 2019-11-19 NOTE — Telephone Encounter (Signed)
This should been 100mg  2 hours prior to CTA

## 2019-11-20 MED ORDER — METOPROLOL TARTRATE 100 MG PO TABS: 100.0000 mg | ORAL_TABLET | ORAL | 0 refills | Status: DC

## 2019-11-23 ENCOUNTER — Encounter: Payer: Self-pay | Admitting: Cardiovascular Disease

## 2019-12-26 ENCOUNTER — Other Ambulatory Visit: Payer: Self-pay

## 2019-12-26 DIAGNOSIS — R072 Precordial pain: Secondary | ICD-10-CM

## 2019-12-26 DIAGNOSIS — R0602 Shortness of breath: Secondary | ICD-10-CM

## 2019-12-26 LAB — BASIC METABOLIC PANEL
BUN/Creatinine Ratio: 14 (ref 12–28)
BUN: 15 mg/dL (ref 8–27)
CO2: 24 mmol/L (ref 20–29)
Calcium: 9.7 mg/dL (ref 8.7–10.3)
Chloride: 102 mmol/L (ref 96–106)
Creatinine, Ser: 1.09 mg/dL — ABNORMAL HIGH (ref 0.57–1.00)
GFR calc Af Amer: 57 mL/min/{1.73_m2} — ABNORMAL LOW (ref >59)
GFR calc non Af Amer: 49 mL/min/{1.73_m2} — ABNORMAL LOW (ref >59)
Glucose: 98 mg/dL (ref 65–99)
Potassium: 4.2 mmol/L (ref 3.5–5.2)
Sodium: 140 mmol/L (ref 134–144)

## 2020-01-01 ENCOUNTER — Encounter (HOSPITAL_COMMUNITY): Payer: Self-pay

## 2020-01-01 ENCOUNTER — Telehealth (HOSPITAL_COMMUNITY): Payer: Self-pay | Admitting: Emergency Medicine

## 2020-01-01 NOTE — Telephone Encounter (Signed)
Left message on voicemail with name and callback number Cira Deyoe RN Navigator Cardiac Imaging [facility] [phone] Office [phone] Cell  

## 2020-01-02 ENCOUNTER — Ambulatory Visit (HOSPITAL_COMMUNITY)
Admission: RE | Admit: 2020-01-02 | Discharge: 2020-01-02 | Disposition: A | Discharge status: Home / Self Care | Payer: Medicare Other | Source: Ambulatory Visit | Attending: Cardiovascular Disease | Admitting: Cardiovascular Disease

## 2020-01-02 ENCOUNTER — Other Ambulatory Visit: Payer: Self-pay

## 2020-01-02 DIAGNOSIS — R072 Precordial pain: Secondary | ICD-10-CM | POA: Insufficient documentation

## 2020-01-02 DIAGNOSIS — R0602 Shortness of breath: Secondary | ICD-10-CM

## 2020-01-02 DIAGNOSIS — I2510 Atherosclerotic heart disease of native coronary artery without angina pectoris: Secondary | ICD-10-CM

## 2020-01-02 DIAGNOSIS — I700 Atherosclerosis of aorta: Secondary | ICD-10-CM | POA: Diagnosis not present

## 2020-01-02 MED ORDER — IOHEXOL 350 MG/ML SOLN
80.0000 mL | Freq: Once | INTRAVENOUS | Status: AC | PRN
  Administered 2020-01-02: 13:00:00 80 mL via INTRAVENOUS

## 2020-01-02 MED ORDER — NITROGLYCERIN 0.4 MG SL SUBL
0.8000 mg | SUBLINGUAL_TABLET | Freq: Once | SUBLINGUAL | Status: AC
  Administered 2020-01-02: 0.8 mg via SUBLINGUAL

## 2020-01-02 MED ORDER — NITROGLYCERIN 0.4 MG SL SUBL
SUBLINGUAL_TABLET | SUBLINGUAL | Status: AC
  Filled 2020-01-02: qty 2

## 2020-01-02 NOTE — Progress Notes (Signed)
CT scan completed. Tolerated well. D/C home ambulatory with husband. Awake and alert. In no distress. 

## 2020-01-04 ENCOUNTER — Other Ambulatory Visit: Payer: Self-pay | Admitting: Cardiovascular Disease

## 2020-01-04 DIAGNOSIS — I209 Angina pectoris, unspecified: Secondary | ICD-10-CM

## 2020-01-04 DIAGNOSIS — I2510 Atherosclerotic heart disease of native coronary artery without angina pectoris: Secondary | ICD-10-CM | POA: Diagnosis not present

## 2020-01-05 ENCOUNTER — Other Ambulatory Visit: Payer: Self-pay | Admitting: *Deleted

## 2020-01-05 NOTE — Progress Notes (Signed)
I have scheduled her cath for 2/25 with Dr Irish Lack. Arrive at 10 for 12. I have also scheduled her covid screening

## 2020-01-07 NOTE — Progress Notes (Signed)
Cardiology Clinic Note   Patient Name: [name] Date of Encounter: 01/08/2020  Primary Care Provider:  Johna Roles, [place] Primary Cardiologist:  Skeet Latch, MD  Patient Profile    [name] presents today for a pre cardiac catheterization visit.  Past Medical History    Past Medical History:  Diagnosis Date  . Colonoscopy causing post-procedural bleeding   . Endometriosis   . Hyperlipidemia   . Myalgia 11/20/2016  . Obesity 08/25/2016  . Shortness of breath 07/21/2017  . Thyroid disease   . TIA (transient ischemic attack) 07/21/2017   Past Surgical History:  Procedure Laterality Date  . CARDIAC CATHETERIZATION      Allergies  Allergies  Allergen Reactions  . Atorvastatin   . Codeine   . Demerol [Meperidine]   . Penicillins Hives  . Pravastatin   . Rosuvastatin     History of Present Illness    [name] has a PMH of familial hyperlipidemia, HTN, TIA, Graves' disease, and depression.  She has reported that she has had hyperlipidemia for several years.  She was intolerant of atorvastatin and rosuvastatin.  She developed chest pain and shortness of breath with both of these medications.  She underwent cardiac catheterization in 2011 which reportedly showed mild CAD.  She was also noted to have a murmur in 2014 and an echocardiogram was reportedly normal at that time.  She was tried on pravastatin 5 mg 3 times per week.  She was followed by cardiology pharmacist and was tolerating pravastatin well however, her hyperlipidemia is not controlled.  Her pravastatin dose was increased which caused myalgias.  Subsequently she was started on Repatha and has been tolerating it well.  She was last seen by Dr. Oval Linsey on 11/15/2019.  During that time she had increasing fatigue and indicated that she had been sleeping more.  She also stated she had 2 episodes where she woke up with severe pain across her chest and her shoulders.  At that time she took baby  aspirin at times.  Her pain persisted for several hours and did not improve with transitioning to a seated position.  Her pain eventually dissipated however, she had pain across her shoulders and lower back that persisted.  She indicated that she had been more short of breath.  Her symptoms were also associated with nausea and increased work of breathing.  Her symptoms occurred mostly at rest and sometimes with exertion.  She continued to have dyspnea.  She also continued daily activities and walking.  She denied lower extremity swelling, orthopnea and PND.  She presents the clinic today for follow-up evaluation/precath visit and states she has continued to have increased fatigue.  She typically dances for long periods of times with her spouse and has been unable to do this over the past several weeks.  She states that she is now dancing 3-4 times per week but is unable to dance 10 to 15 minutes per time.  She states that she has been sleeping much more.  Her CT coronary and FFR were reviewed as well as cardiac catheterization procedure.  I will have her do precath labs and have her follow-up with Dr. [name].  Today she denies chest pain, shortness of breath, lower extremity edema,palpitations, melena, hematuria, hemoptysis, diaphoresis, weakness,  orthopnea, and PND.   Home Medications    Prior to Admission medications   Medication Sig Start Date End Date Taking? Authorizing Provider  amLODipine (NORVASC) 5 MG tablet TAKE 1 TABLET BY  MOUTH DAILY Patient taking differently: Take 5 mg by mouth daily.  09/19/18   Minus Breeding, MD  cholecalciferol (VITAMIN D3) 25 MCG (1000 UNIT) tablet Take 1,000 Units by mouth daily.    [provider]  Evolocumab (REPATHA SURECLICK) XX123456 MG/ML SOAJ Inject 140 mg into the skin every 14 (fourteen) days. 11/15/19   Skeet Latch, MD  levothyroxine (SYNTHROID, LEVOTHROID) 88 MCG tablet Take 88 mcg by mouth daily before breakfast.    [provider]    metoprolol tartrate (LOPRESSOR) 100 MG tablet Take 1 tablet (100 mg total) by mouth as directed. Take 2 hours prior to CTA Patient not taking: Reported on 01/05/2020 11/20/19 02/18/20  Skeet Latch, MD  sertraline (ZOLOFT) 25 MG tablet Take 25 mg by mouth daily.    [provider]    Family History    Family History  Problem Relation Age of Onset  . CAD Mother   . Dementia Mother   . CAD Father   . CAD Sister   . Kidney disease Brother   . Dementia Brother   . Lymphoma Sister   . Breast cancer Neg Hx    She indicated that her mother is deceased. She indicated that her father is deceased. She indicated that her brother is deceased. She indicated that her maternal grandmother is deceased. She indicated that her maternal grandfather is deceased. She indicated that her paternal grandmother is deceased. She indicated that her paternal grandfather is deceased. She indicated that the status of her neg hx is unknown.  Social History    Social History   Socioeconomic History  . Marital status: Married    Spouse name: Not on file  . Number of children: Not on file  . Years of education: Not on file  . Highest education level: Not on file  Occupational History  . Not on file  Tobacco Use  . Smoking status: Former Smoker    Packs/day: 1.00  . Smokeless tobacco: Never Used  Substance and Sexual Activity  . Alcohol use: Yes  . Drug use: No  . Sexual activity: Not Currently  Other Topics Concern  . Not on file  Social History Narrative  . Not on file   Social Determinants of Health   Financial Resource Strain:   . Difficulty of Paying Living Expenses: Not on file  Food Insecurity:   . Worried About Charity fundraiser in the Last Year: Not on file  . Ran Out of Food in the Last Year: Not on file  Transportation Needs:   . Lack of Transportation (Medical): Not on file  . Lack of Transportation (Non-Medical): Not on file  Physical Activity:   . Days of Exercise per  Week: Not on file  . Minutes of Exercise per Session: Not on file  Stress:   . Feeling of Stress : Not on file  Social Connections:   . Frequency of Communication with Friends and Family: Not on file  . Frequency of Social Gatherings with Friends and Family: Not on file  . Attends Religious Services: Not on file  . Active Member of Clubs or Organizations: Not on file  . Attends Archivist Meetings: Not on file  . Marital Status: Not on file  Intimate Partner Violence:   . Fear of Current or Ex-Partner: Not on file  . Emotionally Abused: Not on file  . Physically Abused: Not on file  . Sexually Abused: Not on file     Review of Systems  General:  No chills, fever, night sweats or weight changes.  Cardiovascular:  No chest pain, dyspnea on exertion, edema, orthopnea, palpitations, paroxysmal nocturnal dyspnea. Dermatological: No rash, lesions/masses Respiratory: No cough, dyspnea Urologic: No hematuria, dysuria Abdominal:   No nausea, vomiting, diarrhea, bright red blood per rectum, melena, or hematemesis Neurologic:  No visual changes, wkns, changes in mental status. All other systems reviewed and are otherwise negative except as noted above.  Physical Exam    VS:  BP 114/72 (BP Location: Left Arm, Patient Position: Sitting, Cuff Size: Normal)   Pulse (!) 56   Temp (!) 97.3 F (36.3 C)   Ht 5' (1.524 m)   Wt 173 lb 3.2 oz (78.6 kg)   BMI 33.83 kg/m  , BMI Body mass index is 33.83 kg/m. GEN: Well nourished, well developed, in no acute distress. HEENT: normal. Neck: Supple, no JVD, carotid bruits, or masses. Cardiac: RRR, no murmurs, rubs, or gallops. No clubbing, cyanosis, edema.  Radials/DP/PT 2+ and equal bilaterally.  Respiratory:  Respirations regular and unlabored, clear to auscultation bilaterally. GI: Soft, nontender, nondistended, BS + x 4. MS: no deformity or atrophy. Skin: warm and dry, no rash. Neuro:  Strength and sensation are intact. Psych:  Normal affect.  Accessory Clinical Findings    ECG personally reviewed by me today-sinus bradycardia 56 bpm- No acute changes  EKG 11/15/2019 Sinus rhythm possible left atrial enlargement 61 bpm  Echocardiogram 06/24/2017 LVEF 60 to 123456, grade 1 diastolic dysfunction, and otherwise normal.  CT coronary 01/02/2020 FINDINGS: A 120 kV prospective scan was triggered in the descending thoracic aorta at 111 HU's. Axial non-contrast 3 mm slices were carried out through the heart. The data set was analyzed on a dedicated work station and scored using the [place]. Gantry rotation speed was 250 msecs and collimation was .6 mm. No beta blockade and 0.8 mg of sl NTG was given. The 3D data set was reconstructed in 5% intervals of the 67-82 % of the R-R cycle. Diastolic phases were analyzed on a dedicated work station using MPR, MIP and VRT modes. The patient received 80 cc of contrast.  Aorta: Normal size. Ascending aorta 3.1 cm. Mild calcifications of the aortic root. No dissection.  Aortic Valve:  Trileaflet.  No calcifications.  Coronary Arteries:  Normal coronary origin.  Co-dominance.  RCA is a large dominant artery that gives rise to PDA and PLVB. There is mild (25-49%) mixed attenuation plaque.  Left main is a large artery that gives rise to LAD and LCX arteries. There is minimal (<25%) calcified plaque distally.  LAD is a large vessel. There is heavy calcification of the proximal to mid LAD that appears to be moderately obstructive (50-69%). Stenosis severity may be overestimated due to blooming artifact. There is a very small D1 without significant disease. There is a small D2 with moderate (50-69%) calcified plaque proximally.  LCX is a non-dominant artery that gives rise to one large OM1 branch. There is moderate (50-69%) calcified plaque proximally and severe (>70%) low attenuation plaque just proximal to OM1. OM1 is heavily calcified proximally and there  appears to be severe (>70%) stenosis. There is a large OM2 with minimal (<25%) soft plaque.  FFR FINDINGS: FFRct analysis was performed on the original cardiac CT angiogram dataset. Diagrammatic representation of the FFRct analysis is provided in a separate PDF document in PACS. This dictation was created using the PDF document and an interactive 3D model of the results. 3D model is not available in the  EMR/PACS. Normal FFR range is >0.80.  1. Left Main: findings FFRct 0.99  2. LAD: findings FFRct 0.97 proximal, 0.93 mid, 0.73 mid just after D2. Distal LAD not assessed 3. D2 FFRct 0.90  4. LCX: Findings FFRct 0.95 proximal, 0.83 mid, 0.79 distal 5. OM1 FFRct 0.79 6. RCA: findings FFRct 0.98 proximal, 0.89 mid, distal not assessed.  IMPRESSION: 1. FFRct consistent with significant obstruction in the mid LAD just distal to D2  2.  Recommend cardiac catheterization if clinically indicated.   Assessment & Plan   1.  Coronary artery disease-CT coronary 01/02/2020 with FFR showed significant obstruction in the mid left anterior descending distal to D2. Orders for cardiac catheterization placed Order CBC and BMP  The patient understands that risks include but are not limited to stroke (1 in 1000), death (1 in 53), kidney failure [usually temporary] (1 in 500), bleeding (1 in 200), allergic reaction [possibly serious] (1 in 200), and agrees to proceed.    Hyperlipidemia-LDL 133 10/2019 Continue Repatha 140 mg injection every 14 days. Heart healthy low-sodium high-fiber diet Increase physical activity as tolerated Repeat lipid panel post-cath  Chest pain-no recent chest pain.CT coronary 01/02/2020 with FFR showed significant obstruction in the mid left anterior descending distal to D2.  Has noticed continued exertional dyspnea. Proceed to [facility]  Essential hypertension-BP today 114/72.  Well-controlled at home Continue amlodipine 5 mg daily Heart healthy low-sodium  diet-salty 6 given Increase physical activity as tolerated  Disposition: Follow-up with  Dr. Oval Linsey or me post LHC.  Jossie Ng. [facility] [place] [address] Office [phone] Fax [phone]

## 2020-01-07 NOTE — H&P (View-Only) (Signed)
Cardiology Clinic Note   Patient Name: [name] Date of Encounter: 01/08/2020  Primary Care Provider:  Johna Roles, [place] Primary Cardiologist:  Skeet Latch, MD  Patient Profile    [name]. Wrench presents today for a pre cardiac catheterization visit.  Past Medical History    Past Medical History:  Diagnosis Date  . Colonoscopy causing post-procedural bleeding   . Endometriosis   . Hyperlipidemia   . Myalgia 11/20/2016  . Obesity 08/25/2016  . Shortness of breath 07/21/2017  . Thyroid disease   . TIA (transient ischemic attack) 07/21/2017   Past Surgical History:  Procedure Laterality Date  . CARDIAC CATHETERIZATION      Allergies  Allergies  Allergen Reactions  . Atorvastatin   . Codeine   . Demerol [Meperidine]   . Penicillins Hives  . Pravastatin   . Rosuvastatin     History of Present Illness    Ms. [name] has a PMH of familial hyperlipidemia, HTN, TIA, Graves' disease, and depression.  She has reported that she has had hyperlipidemia for several years.  She was intolerant of atorvastatin and rosuvastatin.  She developed chest pain and shortness of breath with both of these medications.  She underwent cardiac catheterization in 2011 which reportedly showed mild CAD.  She was also noted to have a murmur in 2014 and an echocardiogram was reportedly normal at that time.  She was tried on pravastatin 5 mg 3 times per week.  She was followed by cardiology pharmacist and was tolerating pravastatin well however, her hyperlipidemia is not controlled.  Her pravastatin dose was increased which caused myalgias.  Subsequently she was started on Repatha and has been tolerating it well.  She was last seen by Dr. Oval Linsey on 11/15/2019.  During that time she had increasing fatigue and indicated that she had been sleeping more.  She also stated she had 2 episodes where she woke up with severe pain across her chest and her shoulders.  At that time she took baby  aspirin at times.  Her pain persisted for several hours and did not improve with transitioning to a seated position.  Her pain eventually dissipated however, she had pain across her shoulders and lower back that persisted.  She indicated that she had been more short of breath.  Her symptoms were also associated with nausea and increased work of breathing.  Her symptoms occurred mostly at rest and sometimes with exertion.  She continued to have dyspnea.  She also continued daily activities and walking.  She denied lower extremity swelling, orthopnea and PND.  She presents the clinic today for follow-up evaluation/precath visit and states she has continued to have increased fatigue.  She typically dances for long periods of times with her spouse and has been unable to do this over the past several weeks.  She states that she is now dancing 3-4 times per week but is unable to dance 10 to 15 minutes per time.  She states that she has been sleeping much more.  Her CT coronary and FFR were reviewed as well as cardiac catheterization procedure.  I will have her do precath labs and have her follow-up with Dr. [name].  Today she denies chest pain, shortness of breath, lower extremity edema,palpitations, melena, hematuria, hemoptysis, diaphoresis, weakness,  orthopnea, and PND.   Home Medications    Prior to Admission medications   Medication Sig Start Date End Date Taking? Authorizing Provider  amLODipine (NORVASC) 5 MG tablet TAKE 1 TABLET BY  MOUTH DAILY Patient taking differently: Take 5 mg by mouth daily.  09/19/18   Minus Breeding, MD  cholecalciferol (VITAMIN D3) 25 MCG (1000 UNIT) tablet Take 1,000 Units by mouth daily.    [provider]  Evolocumab (REPATHA SURECLICK) XX123456 MG/ML SOAJ Inject 140 mg into the skin every 14 (fourteen) days. 11/15/19   Skeet Latch, MD  levothyroxine (SYNTHROID, LEVOTHROID) 88 MCG tablet Take 88 mcg by mouth daily before breakfast.    [provider]   metoprolol tartrate (LOPRESSOR) 100 MG tablet Take 1 tablet (100 mg total) by mouth as directed. Take 2 hours prior to CTA Patient not taking: Reported on 01/05/2020 11/20/19 02/18/20  Skeet Latch, MD  sertraline (ZOLOFT) 25 MG tablet Take 25 mg by mouth daily.    [provider]    Family History    Family History  Problem Relation Age of Onset  . CAD Mother   . Dementia Mother   . CAD Father   . CAD Sister   . Kidney disease Brother   . Dementia Brother   . Lymphoma Sister   . Breast cancer Neg Hx    She indicated that her mother is deceased. She indicated that her father is deceased. She indicated that her brother is deceased. She indicated that her maternal grandmother is deceased. She indicated that her maternal grandfather is deceased. She indicated that her paternal grandmother is deceased. She indicated that her paternal grandfather is deceased. She indicated that the status of her neg hx is unknown.  Social History    Social History   Socioeconomic History  . Marital status: Married    Spouse name: Not on file  . Number of children: Not on file  . Years of education: Not on file  . Highest education level: Not on file  Occupational History  . Not on file  Tobacco Use  . Smoking status: Former Smoker    Packs/day: 1.00  . Smokeless tobacco: Never Used  Substance and Sexual Activity  . Alcohol use: Yes  . Drug use: No  . Sexual activity: Not Currently  Other Topics Concern  . Not on file  Social History Narrative  . Not on file   Social Determinants of Health   Financial Resource Strain:   . Difficulty of Paying Living Expenses: Not on file  Food Insecurity:   . Worried About Charity fundraiser in the Last Year: Not on file  . Ran Out of Food in the Last Year: Not on file  Transportation Needs:   . Lack of Transportation (Medical): Not on file  . Lack of Transportation (Non-Medical): Not on file  Physical Activity:   . Days of Exercise per  Week: Not on file  . Minutes of Exercise per Session: Not on file  Stress:   . Feeling of Stress : Not on file  Social Connections:   . Frequency of Communication with Friends and Family: Not on file  . Frequency of Social Gatherings with Friends and Family: Not on file  . Attends Religious Services: Not on file  . Active Member of Clubs or Organizations: Not on file  . Attends Archivist Meetings: Not on file  . Marital Status: Not on file  Intimate Partner Violence:   . Fear of Current or Ex-Partner: Not on file  . Emotionally Abused: Not on file  . Physically Abused: Not on file  . Sexually Abused: Not on file     Review of Systems  General:  No chills, fever, night sweats or weight changes.  Cardiovascular:  No chest pain, dyspnea on exertion, edema, orthopnea, palpitations, paroxysmal nocturnal dyspnea. Dermatological: No rash, lesions/masses Respiratory: No cough, dyspnea Urologic: No hematuria, dysuria Abdominal:   No nausea, vomiting, diarrhea, bright red blood per rectum, melena, or hematemesis Neurologic:  No visual changes, wkns, changes in mental status. All other systems reviewed and are otherwise negative except as noted above.  Physical Exam    VS:  BP 114/72 (BP Location: Left Arm, Patient Position: Sitting, Cuff Size: Normal)   Pulse (!) 56   Temp (!) 97.3 F (36.3 C)   Ht 5' (1.524 m)   Wt 173 lb 3.2 oz (78.6 kg)   BMI 33.83 kg/m  , BMI Body mass index is 33.83 kg/m. GEN: Well nourished, well developed, in no acute distress. HEENT: normal. Neck: Supple, no JVD, carotid bruits, or masses. Cardiac: RRR, no murmurs, rubs, or gallops. No clubbing, cyanosis, edema.  Radials/DP/PT 2+ and equal bilaterally.  Respiratory:  Respirations regular and unlabored, clear to auscultation bilaterally. GI: Soft, nontender, nondistended, BS + x 4. MS: no deformity or atrophy. Skin: warm and dry, no rash. Neuro:  Strength and sensation are intact. Psych:  Normal affect.  Accessory Clinical Findings    ECG personally reviewed by me today-sinus bradycardia 56 bpm- No acute changes  EKG 11/15/2019 Sinus rhythm possible left atrial enlargement 61 bpm  Echocardiogram 06/24/2017 LVEF 60 to 123456, grade 1 diastolic dysfunction, and otherwise normal.  CT coronary 01/02/2020 FINDINGS: A 120 kV prospective scan was triggered in the descending thoracic aorta at 111 HU's. Axial non-contrast 3 mm slices were carried out through the heart. The data set was analyzed on a dedicated work station and scored using the [place]. Gantry rotation speed was 250 msecs and collimation was .6 mm. No beta blockade and 0.8 mg of sl NTG was given. The 3D data set was reconstructed in 5% intervals of the 67-82 % of the R-R cycle. Diastolic phases were analyzed on a dedicated work station using MPR, MIP and VRT modes. The patient received 80 cc of contrast.  Aorta: Normal size. Ascending aorta 3.1 cm. Mild calcifications of the aortic root. No dissection.  Aortic Valve:  Trileaflet.  No calcifications.  Coronary Arteries:  Normal coronary origin.  Co-dominance.  RCA is a large dominant artery that gives rise to PDA and PLVB. There is mild (25-49%) mixed attenuation plaque.  Left main is a large artery that gives rise to LAD and LCX arteries. There is minimal (<25%) calcified plaque distally.  LAD is a large vessel. There is heavy calcification of the proximal to mid LAD that appears to be moderately obstructive (50-69%). Stenosis severity may be overestimated due to blooming artifact. There is a very small D1 without significant disease. There is a small D2 with moderate (50-69%) calcified plaque proximally.  LCX is a non-dominant artery that gives rise to one large OM1 branch. There is moderate (50-69%) calcified plaque proximally and severe (>70%) low attenuation plaque just proximal to OM1. OM1 is heavily calcified proximally and there  appears to be severe (>70%) stenosis. There is a large OM2 with minimal (<25%) soft plaque.  FFR FINDINGS: FFRct analysis was performed on the original cardiac CT angiogram dataset. Diagrammatic representation of the FFRct analysis is provided in a separate PDF document in PACS. This dictation was created using the PDF document and an interactive 3D model of the results. 3D model is not available in the  EMR/PACS. Normal FFR range is >0.80.  1. Left Main: findings FFRct 0.99  2. LAD: findings FFRct 0.97 proximal, 0.93 mid, 0.73 mid just after D2. Distal LAD not assessed 3. D2 FFRct 0.90  4. LCX: Findings FFRct 0.95 proximal, 0.83 mid, 0.79 distal 5. OM1 FFRct 0.79 6. RCA: findings FFRct 0.98 proximal, 0.89 mid, distal not assessed.  IMPRESSION: 1. FFRct consistent with significant obstruction in the mid LAD just distal to D2  2.  Recommend cardiac catheterization if clinically indicated.   Assessment & Plan   1.  Coronary artery disease-CT coronary 01/02/2020 with FFR showed significant obstruction in the mid left anterior descending distal to D2. Orders for cardiac catheterization placed Order CBC and BMP  The patient understands that risks include but are not limited to stroke (1 in 1000), death (1 in 20), kidney failure [usually temporary] (1 in 500), bleeding (1 in 200), allergic reaction [possibly serious] (1 in 200), and agrees to proceed.    Hyperlipidemia-LDL 133 10/2019 Continue Repatha 140 mg injection every 14 days. Heart healthy low-sodium high-fiber diet Increase physical activity as tolerated Repeat lipid panel post-cath  Chest pain-no recent chest pain.CT coronary 01/02/2020 with FFR showed significant obstruction in the mid left anterior descending distal to D2.  Has noticed continued exertional dyspnea. Proceed to [facility]  Essential hypertension-BP today 114/72.  Well-controlled at home Continue amlodipine 5 mg daily Heart healthy low-sodium  diet-salty 6 given Increase physical activity as tolerated  Disposition: Follow-up with  Dr. Oval Linsey or me post LHC.  Jossie Ng. [facility] [place] [address] Office [phone] Fax [phone]

## 2020-01-08 ENCOUNTER — Other Ambulatory Visit: Payer: Self-pay

## 2020-01-08 ENCOUNTER — Ambulatory Visit (INDEPENDENT_AMBULATORY_CARE_PROVIDER_SITE_OTHER): Payer: Medicare Other | Admitting: General Practice

## 2020-01-08 ENCOUNTER — Encounter: Payer: Self-pay | Admitting: General Practice

## 2020-01-08 ENCOUNTER — Other Ambulatory Visit (HOSPITAL_COMMUNITY)
Admission: RE | Admit: 2020-01-08 | Discharge: 2020-01-08 | Disposition: A | Discharge status: Home / Self Care | Payer: Medicare Other | Source: Ambulatory Visit | Attending: Interventional Cardiology | Admitting: Interventional Cardiology

## 2020-01-08 VITALS — BP 114/72 | HR 56 | Temp 97.3°F | Ht 60.0 in | Wt 173.2 lb

## 2020-01-08 DIAGNOSIS — I208 Other forms of angina pectoris: Secondary | ICD-10-CM

## 2020-01-08 DIAGNOSIS — E7800 Pure hypercholesterolemia, unspecified: Secondary | ICD-10-CM

## 2020-01-08 DIAGNOSIS — Z20822 Contact with and (suspected) exposure to covid-19: Secondary | ICD-10-CM | POA: Diagnosis not present

## 2020-01-08 DIAGNOSIS — Z79899 Other long term (current) drug therapy: Secondary | ICD-10-CM

## 2020-01-08 DIAGNOSIS — Z01812 Encounter for preprocedural laboratory examination: Secondary | ICD-10-CM | POA: Insufficient documentation

## 2020-01-08 DIAGNOSIS — I2510 Atherosclerotic heart disease of native coronary artery without angina pectoris: Secondary | ICD-10-CM | POA: Diagnosis not present

## 2020-01-08 DIAGNOSIS — I10 Essential (primary) hypertension: Secondary | ICD-10-CM

## 2020-01-08 LAB — CBC
Hematocrit: 37.2 % (ref 34.0–46.6)
Hemoglobin: 12.4 g/dL (ref 11.1–15.9)
MCH: 30.2 pg (ref 26.6–33.0)
MCHC: 33.3 g/dL (ref 31.5–35.7)
MCV: 91 fL (ref 79–97)
Platelets: 288 10*3/uL (ref 150–450)
RBC: 4.11 x10E6/uL (ref 3.77–5.28)
RDW: 13.9 % (ref 11.7–15.4)
WBC: 5.1 10*3/uL (ref 3.4–10.8)

## 2020-01-08 LAB — BASIC METABOLIC PANEL
BUN/Creatinine Ratio: 21 (ref 12–28)
BUN: 25 mg/dL (ref 8–27)
CO2: 29 mmol/L (ref 20–29)
Calcium: 9.8 mg/dL (ref 8.7–10.3)
Chloride: 103 mmol/L (ref 96–106)
Creatinine, Ser: 1.2 mg/dL — ABNORMAL HIGH (ref 0.57–1.00)
GFR calc Af Amer: 51 mL/min/{1.73_m2} — ABNORMAL LOW (ref >59)
GFR calc non Af Amer: 44 mL/min/{1.73_m2} — ABNORMAL LOW (ref >59)
Glucose: 103 mg/dL — ABNORMAL HIGH (ref 65–99)
Potassium: 4.5 mmol/L (ref 3.5–5.2)
Sodium: 136 mmol/L (ref 134–144)

## 2020-01-08 LAB — SARS CORONAVIRUS 2 (TAT 6-24 HRS): SARS Coronavirus 2: NEGATIVE

## 2020-01-08 NOTE — Patient Instructions (Signed)
Labwork: CBC AND BMET TODAY HERE IN OUR OFFICE AT [facility]   If you have labs (blood work) drawn today and your tests are completely normal, you will receive your results only by: Marland Kitchen MyChart Message (if you have MyChart) OR A paper copy in the mail If you have any lab test that is abnormal or we need to change your treatment, we will call you to review these results. You may go to any [facility] lab that is convenient for you however, we do have a lab in our office that is able to assist you. You do NOT need an appointment for our lab. Once in our office in our office lobby there is a podium where you sign-in and ring the doorbell to alert [place] that you are here. Lab is open 8:00am and closes at 4:00pm; closes for lunch from 12:45 - 1:45pm. PLEASE BRING A COPY OF YOUR INSURANCE CARD WITH YOU.  Special Instructions: COVID TESTING TODAY  CATH SCHEDULED FOR 2-25  Follow-Up: 2 WEEKS AFTER CATH In Person Skeet Latch, MD [place], FNP.    At [facility], you and your health needs are our priority.  As part of our continuing mission to provide you with exceptional heart care, we have created designated Provider Care Teams.  These Care Teams include your primary Cardiologist (physician) and Advanced Practice Providers (APPs -  Physician Assistants and Nurse Practitioners) who all work together to provide you with the care you need, when you need it.  Reduce your risk of getting COVID-19 With your heart disease it is especially important for people at increased risk of severe illness from COVID-19, and those who live with them, to protect themselves from getting COVID-19. The best way to protect yourself and to help reduce the spread of the virus that causes COVID-19 is to: Marland Kitchen Limit your interactions with other people as much as possible. . Take COVID-19 when you do interact with others. If you start feeling sick and think you may have COVID-19, get in touch with your healthcare provider within  24 hours.  Thank you for choosing [facility] at [facility]!!     [place] [address] Dept: [phone] Loc: [phone]  [name]  01/08/2020  You are scheduled for a Cardiac Catheterization on Thursday, February 25 with Dr. Larae Grooms.  1. Please arrive at the [facility] (Main Entrance A) at [facility]: [address] [place], [place] [address] at 10:00 AM (This time is two hours before your procedure to ensure your preparation). Free valet parking service is available.   Special note: Every effort is made to have your procedure done on time. Please understand that emergencies sometimes delay scheduled procedures.  2. Diet: Do not eat solid foods after midnight.  The patient may have clear liquids until 5am upon the day of the procedure.  3. Labs: You will need to have blood drawn on Monday, February 22 at [place]  Open: 8am - 5pm (Lunch 12:30 - 1:30)   Phone: [phone]. You do not need to be fasting.  4. Medication instructions in preparation for your procedure:  On the morning of your procedure, take all your morning medicines.  You may use sips of water.  5. Plan for one night stay--bring personal belongings. 6. Bring a current list of your medications and current insurance cards. 7. You MUST have a  responsible person to drive you home. 8. Someone MUST be with you the first 24 hours after you arrive home or your discharge will be delayed. 9. Please wear clothes that are easy to get on and off and wear slip-on shoes.  Thank you for allowing [place] to care for you!   -- [facility]

## 2020-01-09 ENCOUNTER — Telehealth: Payer: Self-pay | Admitting: *Deleted

## 2020-01-09 NOTE — Telephone Encounter (Signed)
Pt contacted pre-catheterization scheduled at [facility] for: Thursday January 11, 2020 12 Noon Verified arrival time and place: [facility]) at: 7 AM -pre procedure hydration    No solid food after midnight prior to cath, clear liquids until 5 AM day of procedure. Contrast allergy: no  AM meds can be  taken pre-cath with sip of water including: ASA 81 mg   Confirmed patient has responsible adult to drive home post procedure and observe 24 hours after arriving home: yes  Currently, due to Covid-19 pandemic, only one person will be allowed with patient. Must be the same person for patient's entire stay and will be required to wear a mask. They will be asked to wait in the waiting room for the duration of the patient's stay.  Patients are required to wear a mask when they enter the hospital.     COVID-19 Pre-Screening Questions:  . In the past 7 to 10 days have you had a cough,  shortness of breath, headache, congestion, fever (100 or greater) body aches, chills, sore throat, or sudden loss of taste or sense of smell? Shortness of breath, not new,  denies fever . Have you been around anyone with known Covid 19 in the past 7-10 days? no . Have you been around anyone who is awaiting Covid 19 test results in the past 7 to 10 days? no . Have you been around anyone who has been exposed to Covid 19, or has mentioned symptoms of Covid 19 within the past 7 to 10 days? no

## 2020-01-11 ENCOUNTER — Encounter (HOSPITAL_COMMUNITY)
Admission: RE | Disposition: A | Discharge status: Home / Self Care | Payer: Self-pay | Source: Ambulatory Visit | Attending: Interventional Cardiology

## 2020-01-11 ENCOUNTER — Other Ambulatory Visit: Payer: Self-pay

## 2020-01-11 ENCOUNTER — Encounter (HOSPITAL_COMMUNITY): Payer: Self-pay | Admitting: Interventional Cardiology

## 2020-01-11 ENCOUNTER — Ambulatory Visit (HOSPITAL_COMMUNITY)
Admission: RE | Admit: 2020-01-11 | Discharge: 2020-01-11 | Disposition: A | Discharge status: Home / Self Care | Payer: Medicare Other | Source: Ambulatory Visit | Attending: Interventional Cardiology | Admitting: Interventional Cardiology

## 2020-01-11 DIAGNOSIS — Z79899 Other long term (current) drug therapy: Secondary | ICD-10-CM | POA: Diagnosis not present

## 2020-01-11 DIAGNOSIS — Z8673 Personal history of transient ischemic attack (TIA), and cerebral infarction without residual deficits: Secondary | ICD-10-CM | POA: Diagnosis not present

## 2020-01-11 DIAGNOSIS — Z87891 Personal history of nicotine dependence: Secondary | ICD-10-CM | POA: Insufficient documentation

## 2020-01-11 DIAGNOSIS — E785 Hyperlipidemia, unspecified: Secondary | ICD-10-CM | POA: Insufficient documentation

## 2020-01-11 DIAGNOSIS — F329 Major depressive disorder, single episode, unspecified: Secondary | ICD-10-CM | POA: Diagnosis not present

## 2020-01-11 DIAGNOSIS — Z6833 Body mass index (BMI) 33.0-33.9, adult: Secondary | ICD-10-CM | POA: Insufficient documentation

## 2020-01-11 DIAGNOSIS — I2510 Atherosclerotic heart disease of native coronary artery without angina pectoris: Secondary | ICD-10-CM | POA: Diagnosis not present

## 2020-01-11 DIAGNOSIS — Z79890 Hormone replacement therapy (postmenopausal): Secondary | ICD-10-CM | POA: Diagnosis not present

## 2020-01-11 DIAGNOSIS — R0609 Other forms of dyspnea: Secondary | ICD-10-CM | POA: Insufficient documentation

## 2020-01-11 DIAGNOSIS — I10 Essential (primary) hypertension: Secondary | ICD-10-CM | POA: Diagnosis not present

## 2020-01-11 DIAGNOSIS — E079 Disorder of thyroid, unspecified: Secondary | ICD-10-CM | POA: Insufficient documentation

## 2020-01-11 DIAGNOSIS — E669 Obesity, unspecified: Secondary | ICD-10-CM | POA: Diagnosis not present

## 2020-01-11 DIAGNOSIS — I209 Angina pectoris, unspecified: Secondary | ICD-10-CM

## 2020-01-11 HISTORY — PX: LEFT HEART CATH AND CORONARY ANGIOGRAPHY: CATH118249

## 2020-01-11 HISTORY — DX: Atherosclerotic heart disease of native coronary artery without angina pectoris: I25.10

## 2020-01-11 HISTORY — PX: INTRAVASCULAR PRESSURE WIRE/FFR STUDY: CATH118243

## 2020-01-11 LAB — POCT ACTIVATED CLOTTING TIME: Activated Clotting Time: 290 seconds

## 2020-01-11 SURGERY — LEFT HEART CATH AND CORONARY ANGIOGRAPHY
Anesthesia: LOCAL

## 2020-01-11 MED ORDER — IOHEXOL 350 MG/ML SOLN
INTRAVENOUS | Status: DC | PRN
  Administered 2020-01-11: 60 mL

## 2020-01-11 MED ORDER — SODIUM CHLORIDE 0.9% FLUSH: 3.0000 mL | INTRAVENOUS | Status: DC | PRN

## 2020-01-11 MED ORDER — SODIUM CHLORIDE 0.9 % WEIGHT BASED INFUSION
3.0000 mL/kg/h | INTRAVENOUS | Status: AC
  Administered 2020-01-11: 3 mL/kg/h via INTRAVENOUS

## 2020-01-11 MED ORDER — SODIUM CHLORIDE 0.9 % WEIGHT BASED INFUSION: 1.0000 mL/kg/h | INTRAVENOUS | Status: DC

## 2020-01-11 MED ORDER — IOHEXOL 350 MG/ML SOLN
INTRAVENOUS | Status: AC
  Filled 2020-01-11: qty 1

## 2020-01-11 MED ORDER — SODIUM CHLORIDE 0.9 % IV SOLN: 250.0000 mL | INTRAVENOUS | Status: DC | PRN

## 2020-01-11 MED ORDER — ASPIRIN 81 MG PO CHEW: 81.0000 mg | CHEWABLE_TABLET | ORAL | Status: DC

## 2020-01-11 MED ORDER — HEPARIN (PORCINE) IN NACL 1000-0.9 UT/500ML-% IV SOLN
INTRAVENOUS | Status: AC
  Filled 2020-01-11: qty 1000

## 2020-01-11 MED ORDER — HEPARIN (PORCINE) IN NACL 1000-0.9 UT/500ML-% IV SOLN
INTRAVENOUS | Status: DC | PRN
  Administered 2020-01-11 (×2): 500 mL

## 2020-01-11 MED ORDER — HEPARIN SODIUM (PORCINE) 1000 UNIT/ML IJ SOLN
INTRAMUSCULAR | Status: DC | PRN
  Administered 2020-01-11 (×2): 4000 [IU] via INTRAVENOUS

## 2020-01-11 MED ORDER — LIDOCAINE HCL (PF) 1 % IJ SOLN
INTRAMUSCULAR | Status: DC | PRN
  Administered 2020-01-11: 2 mL

## 2020-01-11 MED ORDER — FENTANYL CITRATE (PF) 100 MCG/2ML IJ SOLN
INTRAMUSCULAR | Status: AC
  Filled 2020-01-11: qty 2

## 2020-01-11 MED ORDER — FENTANYL CITRATE (PF) 100 MCG/2ML IJ SOLN
INTRAMUSCULAR | Status: DC | PRN
  Administered 2020-01-11: 25 ug via INTRAVENOUS

## 2020-01-11 MED ORDER — MIDAZOLAM HCL 2 MG/2ML IJ SOLN
INTRAMUSCULAR | Status: DC | PRN
  Administered 2020-01-11: 2 mg via INTRAVENOUS

## 2020-01-11 MED ORDER — SODIUM CHLORIDE 0.9% FLUSH: 3.0000 mL | Freq: Two times a day (BID) | INTRAVENOUS | Status: DC

## 2020-01-11 MED ORDER — LIDOCAINE HCL (PF) 1 % IJ SOLN
INTRAMUSCULAR | Status: AC
  Filled 2020-01-11: qty 30

## 2020-01-11 MED ORDER — SODIUM CHLORIDE 0.9 % IV SOLN: INTRAVENOUS | Status: AC

## 2020-01-11 MED ORDER — VERAPAMIL HCL 2.5 MG/ML IV SOLN
INTRAVENOUS | Status: DC | PRN
  Administered 2020-01-11: 10 mL via INTRA_ARTERIAL

## 2020-01-11 MED ORDER — MIDAZOLAM HCL 2 MG/2ML IJ SOLN
INTRAMUSCULAR | Status: AC
  Filled 2020-01-11: qty 2

## 2020-01-11 MED ORDER — HEPARIN SODIUM (PORCINE) 1000 UNIT/ML IJ SOLN
INTRAMUSCULAR | Status: AC
  Filled 2020-01-11: qty 1

## 2020-01-11 MED ORDER — ADENOSINE 12 MG/4ML IV SOLN
INTRAVENOUS | Status: AC
  Filled 2020-01-11: qty 16

## 2020-01-11 MED ORDER — VERAPAMIL HCL 2.5 MG/ML IV SOLN
INTRAVENOUS | Status: AC
  Filled 2020-01-11: qty 2

## 2020-01-11 MED ORDER — ADENOSINE (DIAGNOSTIC) 140MCG/KG/MIN
INTRAVENOUS | Status: DC | PRN
  Administered 2020-01-11: 140 ug/kg/min via INTRAVENOUS

## 2020-01-11 SURGICAL SUPPLY — 13 items
CATH 5FR JL3.5 JR4 ANG PIG MP (CATHETERS) ×1 IMPLANT
CATH LAUNCHER 6FR EBU3.5 (CATHETERS) ×1 IMPLANT
DEVICE RAD COMP TR BAND LRG (VASCULAR PRODUCTS) ×1 IMPLANT
GLIDESHEATH SLEND SS 6F .021 (SHEATH) ×1 IMPLANT
GUIDEWIRE INQWIRE 1.5J.035X260 (WIRE) IMPLANT
GUIDEWIRE PRESSURE COMET II (WIRE) ×1 IMPLANT
INQWIRE 1.5J .035X260CM (WIRE) ×2
KIT HEART LEFT (KITS) ×2 IMPLANT
KIT HEMO VALVE WATCHDOG (MISCELLANEOUS) ×1 IMPLANT
PACK CARDIAC CATHETERIZATION (CUSTOM PROCEDURE TRAY) ×2 IMPLANT
SHEATH PROBE COVER 6X72 (BAG) ×1 IMPLANT
TRANSDUCER W/STOPCOCK (MISCELLANEOUS) ×2 IMPLANT
TUBING CIL FLEX 10 FLL-RA (TUBING) ×2 IMPLANT

## 2020-01-11 NOTE — Interval H&P Note (Signed)
Cath Lab Visit (complete for each Cath Lab visit)  Clinical Evaluation Leading to the Procedure:   ACS: No.  Non-ACS:    Anginal Classification: CCS III  Anti-ischemic medical therapy: Minimal Therapy (1 class of medications)  Non-Invasive Test Results: Intermediate-risk stress test findings: cardiac mortality 1-3%/year  Prior CABG: No previous CABG   Abnormal CT   History and Physical Interval Note:  01/11/2020 1:16 PM  [name]  has presented today for surgery, with the diagnosis of abnormal cardiac CT.  The various methods of treatment have been discussed with the patient and family. After consideration of risks, benefits and other options for treatment, the patient has consented to  Procedure(s): LEFT HEART CATH AND CORONARY ANGIOGRAPHY (N/A) as a surgical intervention.  The patient's history has been reviewed, patient examined, no change in status, stable for surgery.  I have reviewed the patient's chart and labs.  Questions were answered to the patient's satisfaction.     Larae Grooms

## 2020-01-11 NOTE — Discharge Instructions (Signed)
Radial Site Care  This sheet gives you information about how to care for yourself after your procedure. Your health care provider may also give you more specific instructions. If you have problems or questions, contact your health care provider. What can I expect after the procedure? After the procedure, it is common to have:  Bruising and tenderness at the catheter insertion area. Follow these instructions at home: Medicines  Take over-the-counter and prescription medicines only as told by your health care provider. Insertion site care  Follow instructions from your health care provider about how to take care of your insertion site. Make sure you: ? Wash your hands with soap and water before you change your bandage (dressing). If soap and water are not available, use hand sanitizer. ? Change your dressing as told by your health care provider. ? Leave stitches (sutures), skin glue, or adhesive strips in place. These skin closures may need to stay in place for 2 weeks or longer. If adhesive strip edges start to loosen and curl up, you may trim the loose edges. Do not remove adhesive strips completely unless your health care provider tells you to do that.  Check your insertion site every day for signs of infection. Check for: ? Redness, swelling, or pain. ? Fluid or blood. ? Pus or a bad smell. ? Warmth.  Do not take baths, swim, or use a hot tub until your health care provider approves.  You may shower 24 hours after the procedure, or as directed by your health care provider. ? Remove the dressing and gently wash the site with plain soap and water. ? Pat the area dry with a clean towel. ? Do not rub the site. That could cause bleeding.  Do not apply powder or lotion to the site. Activity   For 24 hours after the procedure, or as directed by your health care provider: ? Do not flex or bend the affected arm. ? Do not push or pull heavy objects with the affected arm. ? Do not drive  yourself home from the hospital or clinic. You may drive 24 hours after the procedure unless your health care provider tells you not to. ? Do not operate machinery or power tools.  Do not lift anything that is heavier than 10 lb (4.5 kg), or the limit that you are told, until your health care provider says that it is safe.  Ask your health care provider when it is okay to: ? Return to work or school. ? Resume usual physical activities or sports. ? Resume sexual activity. General instructions  If the catheter site starts to bleed, raise your arm and put firm pressure on the site. If the bleeding does not stop, get help right away. This is a medical emergency.  If you went home on the same day as your procedure, a responsible adult should be with you for the first 24 hours after you arrive home.  Keep all follow-up visits as told by your health care provider. This is important. Contact a health care provider if:  You have a fever.  You have redness, swelling, or yellow drainage around your insertion site. Get help right away if:  You have unusual pain at the radial site.  The catheter insertion area swells very fast.  The insertion area is bleeding, and the bleeding does not stop when you hold steady pressure on the area.  Your arm or hand becomes pale, cool, tingly, or numb. These symptoms may represent a serious problem  that is an emergency. Do not wait to see if the symptoms will go away. Get medical help right away. Call your local emergency services (911 in the [place] Do not drive yourself to the hospital. Summary  After the procedure, it is common to have bruising and tenderness at the site.  Follow instructions from your health care provider about how to take care of your radial site wound. Check the wound every day for signs of infection.  Do not lift anything that is heavier than 10 lb (4.5 kg), or the limit that you are told, until your health care provider says that it  is safe. This information is not intended to replace advice given to you by your health care provider. Make sure you discuss any questions you have with your health care provider. Document Revised: 12/08/2017 Document Reviewed: 12/08/2017 Elsevier Patient Education  2020 [employer].

## 2020-01-24 NOTE — Progress Notes (Signed)
Cardiology Clinic Note   Patient Name: [name] Date of Encounter: 01/25/2020  Primary Care Provider:  Johna Roles, [place] Primary Cardiologist:  Skeet Latch, MD  Patient Profile    [name]. [name] presents today for a pre cardiac catheterization visit.  Past Medical History    Past Medical History:  Diagnosis Date  . Colonoscopy causing post-procedural bleeding   . Coronary artery calcification seen on computed tomography   . Endometriosis   . Hyperlipidemia   . Myalgia 11/20/2016  . Obesity 08/25/2016  . Shortness of breath 07/21/2017  . Thyroid disease   . TIA (transient ischemic attack) 07/21/2017   Past Surgical History:  Procedure Laterality Date  . CARDIAC CATHETERIZATION    . INTRAVASCULAR PRESSURE WIRE/FFR STUDY N/A 01/11/2020   Procedure: INTRAVASCULAR PRESSURE WIRE/FFR STUDY;  Surgeon: Jettie Booze, MD;  Location: [place] [facility];  Service: Cardiovascular;  Laterality: N/A;  . LEFT HEART CATH AND CORONARY ANGIOGRAPHY N/A 01/11/2020   Procedure: LEFT HEART CATH AND CORONARY ANGIOGRAPHY;  Surgeon: Jettie Booze, MD;  Location: [place] [facility];  Service: Cardiovascular;  Laterality: N/A;    Allergies  Allergies  Allergen Reactions  . Atorvastatin   . Codeine   . Demerol [Meperidine]   . Penicillins Hives  . Pravastatin   . Rosuvastatin     History of Present Illness    [name] has a PMH of familial hyperlipidemia, HTN, TIA, Graves' disease, and depression.  She has reported that she has had hyperlipidemia for several years.  She was intolerant of atorvastatin and rosuvastatin.  She developed chest pain and shortness of breath with both of these medications.  She underwent cardiac catheterization in 2011 which reportedly showed mild CAD.  She was also noted to have a murmur in 2014 and an echocardiogram was reportedly normal at that time.  She was tried on pravastatin 5 mg 3 times per week.  She was followed by cardiology  pharmacist and was tolerating pravastatin well however, her hyperlipidemia is not controlled.  Her pravastatin dose was increased which caused myalgias.  Subsequently she was started on Repatha and has been tolerating it well.  She was last seen by [name] on 11/15/2019.  During that time she had increasing fatigue and indicated that she had been sleeping more.  She also stated she had 2 episodes where she woke up with severe pain across her chest and her shoulders.  At that time she took baby aspirin at times.  Her pain persisted for several hours and did not improve with transitioning to a seated position.  Her pain eventually dissipated however, she had pain across her shoulders and lower back that persisted.  She indicated that she had been more short of breath.  Her symptoms were also associated with nausea and increased work of breathing.  Her symptoms occurred mostly at rest and sometimes with exertion.  She continued to have dyspnea.  She also continued daily activities and walking.  She denied lower extremity swelling, orthopnea and PND.  She presents the clinic 01/08/20 for follow-up evaluation/precath visit and stated she had continued to have increased fatigue.  She typically danced for long periods of times with her spouse and had been unable to do this over the past several weeks.  She stated that she was  dancing 3-4 times per week but was unable to dance 10 to 15 minutes per time.  She stated that she had been sleeping much more.  Her  CT coronary and FFR were reviewed as well as cardiac catheterization procedure.  Her cardiac catheterization 01/11/2020 showed a proximal circumflex lesion with 50% stenosis and a mid LAD lesion with 40% stenosis, normal LV systolic function, normal LV end-diastolic pressures, and LVEF of 55-65% and no aortic valve stenosis.  Aggressive risk modification was recommended.  No complications she was discharged 01/11/2020.  She presents the clinic today for  follow-up evaluation and states she feels well.  She had a pleasant experience in the Cath Lab and felt well looked after.  She has had less fatigue and has had no chest discomfort since before her cardiac catheterization.  She states her hand and wrist have been somewhat stiff although, she has not been as active with her right arm since her cardiac catheterization.  She continues with her Repatha and has been eating a low-sodium diet.  She is ready to get back to her normal activities.  I will have her increase her activity as tolerated, repeat a lipid panel, and have her follow-up in 6 months.  Today she denies chest pain, shortness of breath, lower extremity edema,palpitations, melena, hematuria, hemoptysis, diaphoresis, weakness,  orthopnea, and PND.  Home Medications    Prior to Admission medications   Medication Sig Start Date End Date Taking? Authorizing Provider  amLODipine (NORVASC) 5 MG tablet TAKE 1 TABLET BY MOUTH DAILY Patient taking differently: Take 5 mg by mouth daily.  09/19/18   Minus Breeding, MD  cholecalciferol (VITAMIN D3) 25 MCG (1000 UNIT) tablet Take 1,000 Units by mouth daily.    [provider]  Evolocumab (REPATHA SURECLICK) XX123456 MG/ML SOAJ Inject 140 mg into the skin every 14 (fourteen) days. 11/15/19   Skeet Latch, MD  levothyroxine (SYNTHROID, LEVOTHROID) 88 MCG tablet Take 88 mcg by mouth daily before breakfast.    [provider]  sertraline (ZOLOFT) 25 MG tablet Take 25 mg by mouth daily.    [provider]    Family History    Family History  Problem Relation Age of Onset  . CAD Mother   . Dementia Mother   . CAD Father   . CAD Sister   . Kidney disease Brother   . Dementia Brother   . Lymphoma Sister   . Breast cancer Neg Hx    She indicated that her mother is deceased. She indicated that her father is deceased. She indicated that her brother is deceased. She indicated that her maternal grandmother is deceased. She  indicated that her maternal grandfather is deceased. She indicated that her paternal grandmother is deceased. She indicated that her paternal grandfather is deceased. She indicated that the status of her neg hx is unknown.  Social History    Social History   Socioeconomic History  . Marital status: Married    Spouse name: Not on file  . Number of children: Not on file  . Years of education: Not on file  . Highest education level: Not on file  Occupational History  . Not on file  Tobacco Use  . Smoking status: Former Smoker    Packs/day: 1.00  . Smokeless tobacco: Never Used  Substance and Sexual Activity  . Alcohol use: Yes  . Drug use: No  . Sexual activity: Not Currently  Other Topics Concern  . Not on file  Social History Narrative  . Not on file   Social Determinants of Health   Financial Resource Strain:   . Difficulty of Paying Living Expenses:   Food Insecurity:   .  Worried About Charity fundraiser in the Last Year:   . Arboriculturist in the Last Year:   Transportation Needs:   . Film/video editor (Medical):   Marland Kitchen Lack of Transportation (Non-Medical):   Physical Activity:   . Days of Exercise per Week:   . Minutes of Exercise per Session:   Stress:   . Feeling of Stress :   Social Connections:   . Frequency of Communication with Friends and Family:   . Frequency of Social Gatherings with Friends and Family:   . Attends Religious Services:   . Active Member of Clubs or Organizations:   . Attends Archivist Meetings:   Marland Kitchen Marital Status:   Intimate Partner Violence:   . Fear of Current or Ex-Partner:   . Emotionally Abused:   Marland Kitchen Physically Abused:   . Sexually Abused:      Review of Systems    General:  No chills, fever, night sweats or weight changes.  Cardiovascular:  No chest pain, dyspnea on exertion, edema, orthopnea, palpitations, paroxysmal nocturnal dyspnea. Dermatological: No rash, lesions/masses Respiratory: No cough,  dyspnea Urologic: No hematuria, dysuria Abdominal:   No nausea, vomiting, diarrhea, bright red blood per rectum, melena, or hematemesis Neurologic:  No visual changes, wkns, changes in mental status. All other systems reviewed and are otherwise negative except as noted above.  Physical Exam    VS:  BP 118/68 (BP Location: Left Arm, Patient Position: Sitting, Cuff Size: Normal)   Pulse 68   Wt 170 lb 9.6 oz (77.4 kg)   SpO2 96%   BMI 33.32 kg/m  , BMI Body mass index is 33.32 kg/m. GEN: Well nourished, well developed, in no acute distress. HEENT: normal. Neck: Supple, no JVD, carotid bruits, or masses. Cardiac: RRR, no murmurs, rubs, or gallops. No clubbing, cyanosis, edema.  Radials/DP/PT 2+ and equal bilaterally.  Respiratory:  Respirations regular and unlabored, clear to auscultation bilaterally. GI: Soft, nontender, nondistended, BS + x 4. MS: no deformity or atrophy. Skin: warm and dry, no rash. Neuro:  Strength and sensation are intact. Psych: Normal affect.  Accessory Clinical Findings    ECG personally reviewed by me today-none today.  EKG 01/08/2020  sinus bradycardia 56 bpm- No acute changes  EKG 11/15/2019 Sinus rhythm possible left atrial enlargement 61 bpm  Echocardiogram 06/24/2017 LVEF 60 to 123456, grade 1 diastolic dysfunction, and otherwise normal.  CT coronary 01/02/2020 FINDINGS: A 120 kV prospective scan was triggered in the descending thoracic aorta at 111 HU's. Axial non-contrast 3 mm slices were carried out through the heart. The data set was analyzed on a dedicated work station and scored using the [place]. Gantry rotation speed was 250 msecs and collimation was .6 mm. No beta blockade and 0.8 mg of sl NTG was given. The 3D data set was reconstructed in 5% intervals of the 67-82 % of the R-R cycle. Diastolic phases were analyzed on a dedicated work station using MPR, MIP and VRT modes. The patient received 80 cc of contrast.  Aorta:  Normal size. Ascending aorta 3.1 cm. Mild calcifications of the aortic root. No dissection.  Aortic Valve: Trileaflet. No calcifications.  Coronary Arteries: Normal coronary origin. Co-dominance.  RCA is a large dominant artery that gives rise to PDA and PLVB. There is mild (25-49%) mixed attenuation plaque.  Left main is a large artery that gives rise to LAD and LCX arteries. There is minimal (<25%) calcified plaque distally.  LAD is a large vessel.  There is heavy calcification of the proximal to mid LAD that appears to be moderately obstructive (50-69%). Stenosis severity may be overestimated due to blooming artifact. There is a very small D1 without significant disease. There is a small D2 with moderate (50-69%) calcified plaque proximally.  LCX is a non-dominant artery that gives rise to one large OM1 branch. There is moderate (50-69%) calcified plaque proximally and severe (>70%) low attenuation plaque just proximal to OM1. OM1 is heavily calcified proximally and there appears to be severe (>70%) stenosis. There is a large OM2 with minimal (<25%) soft plaque.  FFR FINDINGS: FFRct analysis was performed on the original cardiac CT angiogram dataset. Diagrammatic representation of the FFRct analysis is provided in a separate PDF document in PACS. This dictation was created using the PDF document and an interactive 3D model of the results. 3D model is not available in the EMR/PACS. Normal FFR range is >0.80.  1. Left Main: findings FFRct 0.99  2. LAD: findings FFRct 0.97 proximal, 0.93 mid, 0.73 mid just after D2. Distal LAD not assessed 3. D2 FFRct 0.90  4. LCX: Findings FFRct 0.95 proximal, 0.83 mid, 0.79 distal 5. OM1 FFRct 0.79 6. RCA: findings FFRct 0.98 proximal, 0.89 mid, distal not assessed.  IMPRESSION: 1. FFRct consistent with significant obstruction in the mid LAD just distal to D2  2. Recommend cardiac catheterization if clinically  indicated.  Cardiac catheterization 01/11/2020  Prox Cx lesion is 50% stenosed. Not significant by DFR, 1.0.  Mid LAD lesion is 40% stenosed, not significant by FFR with IV adenosine, 0.83.  The left ventricular systolic function is normal.  LV end diastolic pressure is normal.  The left ventricular ejection fraction is 55-65% by visual estimate.  There is no aortic valve stenosis.   Continue aggressive risk factor modification.  Plan discharge later today  Assessment & Plan   1.  Coronary artery disease-Cardiac catheterization 01/11/2020 showed a proximal circumflex lesion with 50% stenosis and a mid LAD lesion with 40% stenosis, normal LV systolic function, normal LV end-diastolic pressures, and LVEF of 55-65% and no aortic valve stenosis.CT coronary 01/02/2020 with FFR showed significant obstruction in the mid left anterior descending distal to D2. 01/08/2020 CBC and BMP WDL Aggressive risk modification recommended Continue amlodipine 5 mg tablet daily Continue Repatha 140 mg every 14 days Heart healthy low-sodium diet-salty 6 given Increase physical activity as tolerated  Hyperlipidemia-LDL 133 10/2019 Continue Repatha 140 mg injection every 14 days. Heart healthy low-sodium high-fiber diet Increase physical activity as tolerated Repeat lipid panel   Chest pain-no recent chest pain. Cardiac catheterization 01/11/2020 showed a proximal circumflex lesion with 50% stenosis and a mid LAD lesion with 40% stenosis, normal LV systolic function, normal LV end-diastolic pressures, and LVEF of 55-65% and no aortic valve stenosis.  Essential hypertension-BP today  118/68.  Well-controlled at home Continue amlodipine 5 mg daily Heart healthy low-sodium diet-salty 6 given Increase physical activity as tolerated  Disposition: Follow-up with  [name] in 6 months.  Jossie Ng. [facility] [place] [address] Office [phone] Fax  [phone]

## 2020-01-25 ENCOUNTER — Ambulatory Visit (INDEPENDENT_AMBULATORY_CARE_PROVIDER_SITE_OTHER): Payer: Medicare Other | Admitting: General Practice

## 2020-01-25 ENCOUNTER — Encounter: Payer: Self-pay | Admitting: General Practice

## 2020-01-25 ENCOUNTER — Other Ambulatory Visit: Payer: Self-pay

## 2020-01-25 VITALS — BP 118/68 | HR 68 | Wt 170.6 lb

## 2020-01-25 DIAGNOSIS — R0789 Other chest pain: Secondary | ICD-10-CM | POA: Diagnosis not present

## 2020-01-25 DIAGNOSIS — I2510 Atherosclerotic heart disease of native coronary artery without angina pectoris: Secondary | ICD-10-CM | POA: Diagnosis not present

## 2020-01-25 DIAGNOSIS — Z79899 Other long term (current) drug therapy: Secondary | ICD-10-CM

## 2020-01-25 DIAGNOSIS — E7800 Pure hypercholesterolemia, unspecified: Secondary | ICD-10-CM

## 2020-01-25 DIAGNOSIS — I10 Essential (primary) hypertension: Secondary | ICD-10-CM | POA: Diagnosis not present

## 2020-01-25 NOTE — Patient Instructions (Signed)
Labwork: FASTING LIPID PANEL HERE IN OUR OFFICE AT [facility]    You will need to fast. DO NOT EAT OR DRINK PAST MIDNIGHT.       If you have labs (blood work) drawn today and your tests are completely normal, you will receive your results only by: Marland Kitchen MyChart Message (if you have MyChart) OR A paper copy in the mail If you have any lab test that is abnormal or we need to change your treatment, we will call you to review these results. You may go to any [facility] lab that is convenient for you however, we do have a lab in our office that is able to assist you. You do NOT need an appointment for our lab. Once in our office in our office lobby there is a podium where you sign-in and ring the doorbell to alert [place] that you are here. Lab is open 8:00am and closes at 4:00pm; closes for lunch from 12:45 - 1:45pm. PLEASE BRING A COPY OF YOUR INSURANCE CARD WITH YOU.  Special Instructions: INCREASE PHYSICAL ACTIVITY AS TOLERATED  MAY USE BIOTENE FOR DRY MOUTH MAY ALSO USE SUGAR-FREE CANDY OR GUM  PLEASE READ AND FOLLOW SALTY 6 ATTACHED  Follow-Up: 6 months Please call our office 2 months in advance, JUL 2021 to schedule this SEPT 2021 appointment. Either In Person or Virtual Skeet Latch, MD.    At [facility], you and your health needs are our priority.  As part of our continuing mission to provide you with exceptional heart care, we have created designated Provider Care Teams.  These Care Teams include your primary Cardiologist (physician) and Advanced Practice Providers (APPs -  Physician Assistants and Nurse Practitioners) who all work together to provide you with the care you need, when you need it.  Reduce your risk of getting COVID-19 With your heart disease it is especially important for people at increased risk of severe illness from COVID-19, and those who live with them, to protect themselves from getting COVID-19. The best way to protect yourself and to help reduce the spread of the virus  that causes COVID-19 is to: Marland Kitchen Limit your interactions with other people as much as possible. . Take COVID-19 when you do interact with others. If you start feeling sick and think you may have COVID-19, get in touch with your healthcare provider within 24 hours.  Thank you for choosing [facility] at [facility]!!

## 2020-06-13 ENCOUNTER — Telehealth: Payer: Self-pay | Admitting: *Deleted

## 2020-06-13 NOTE — Telephone Encounter (Signed)
A detailed message was left, re: her follow up visit. 

## 2020-09-20 ENCOUNTER — Ambulatory Visit: Payer: Medicare Other | Admitting: Cardiovascular Disease

## 2021-01-07 ENCOUNTER — Encounter: Payer: Self-pay | Admitting: Cardiovascular Disease

## 2021-01-13 DIAGNOSIS — J069 Acute upper respiratory infection, unspecified: Secondary | ICD-10-CM | POA: Diagnosis not present

## 2021-03-05 DIAGNOSIS — E039 Hypothyroidism, unspecified: Secondary | ICD-10-CM | POA: Diagnosis not present

## 2021-03-05 DIAGNOSIS — N3946 Mixed incontinence: Secondary | ICD-10-CM | POA: Diagnosis not present

## 2021-03-05 DIAGNOSIS — K219 Gastro-esophageal reflux disease without esophagitis: Secondary | ICD-10-CM | POA: Diagnosis not present

## 2021-03-05 DIAGNOSIS — I10 Essential (primary) hypertension: Secondary | ICD-10-CM | POA: Diagnosis not present

## 2021-03-05 DIAGNOSIS — F329 Major depressive disorder, single episode, unspecified: Secondary | ICD-10-CM | POA: Diagnosis not present

## 2021-03-05 DIAGNOSIS — Z0000 Encounter for general adult medical examination without abnormal findings: Secondary | ICD-10-CM | POA: Diagnosis not present

## 2021-03-05 DIAGNOSIS — R1310 Dysphagia, unspecified: Secondary | ICD-10-CM | POA: Diagnosis not present

## 2021-03-05 DIAGNOSIS — E7800 Pure hypercholesterolemia, unspecified: Secondary | ICD-10-CM | POA: Diagnosis not present

## 2021-03-05 DIAGNOSIS — M48061 Spinal stenosis, lumbar region without neurogenic claudication: Secondary | ICD-10-CM | POA: Diagnosis not present

## 2021-03-05 DIAGNOSIS — E559 Vitamin D deficiency, unspecified: Secondary | ICD-10-CM | POA: Diagnosis not present

## 2021-03-05 DIAGNOSIS — G5603 Carpal tunnel syndrome, bilateral upper limbs: Secondary | ICD-10-CM | POA: Diagnosis not present

## 2021-03-05 DIAGNOSIS — M8580 Other specified disorders of bone density and structure, unspecified site: Secondary | ICD-10-CM | POA: Diagnosis not present

## 2021-03-06 ENCOUNTER — Other Ambulatory Visit: Payer: Self-pay | Admitting: Physician Assistant

## 2021-03-06 DIAGNOSIS — R1310 Dysphagia, unspecified: Secondary | ICD-10-CM

## 2021-03-11 ENCOUNTER — Ambulatory Visit: Payer: Medicare Other | Admitting: Cardiovascular Disease

## 2021-03-17 ENCOUNTER — Other Ambulatory Visit: Payer: Self-pay | Admitting: Physician Assistant

## 2021-03-17 ENCOUNTER — Other Ambulatory Visit: Payer: Self-pay | Admitting: Adult Medicine

## 2021-03-17 DIAGNOSIS — M48062 Spinal stenosis, lumbar region with neurogenic claudication: Secondary | ICD-10-CM

## 2021-03-17 DIAGNOSIS — Z6832 Body mass index (BMI) 32.0-32.9, adult: Secondary | ICD-10-CM | POA: Diagnosis not present

## 2021-03-17 DIAGNOSIS — G5603 Carpal tunnel syndrome, bilateral upper limbs: Secondary | ICD-10-CM | POA: Diagnosis not present

## 2021-03-28 ENCOUNTER — Ambulatory Visit
Admission: RE | Admit: 2021-03-28 | Discharge: 2021-03-28 | Disposition: A | Discharge status: Home / Self Care | Payer: Medicare Other | Source: Ambulatory Visit | Attending: Physician Assistant | Admitting: Physician Assistant

## 2021-03-28 DIAGNOSIS — K219 Gastro-esophageal reflux disease without esophagitis: Secondary | ICD-10-CM | POA: Diagnosis not present

## 2021-03-28 DIAGNOSIS — R1310 Dysphagia, unspecified: Secondary | ICD-10-CM

## 2021-03-31 ENCOUNTER — Other Ambulatory Visit: Payer: Medicare Other

## 2021-03-31 ENCOUNTER — Ambulatory Visit
Admission: RE | Admit: 2021-03-31 | Discharge: 2021-03-31 | Disposition: A | Discharge status: Home / Self Care | Payer: Medicare Other | Source: Ambulatory Visit | Attending: Physician Assistant | Admitting: Physician Assistant

## 2021-03-31 ENCOUNTER — Other Ambulatory Visit: Payer: Self-pay

## 2021-03-31 DIAGNOSIS — M48061 Spinal stenosis, lumbar region without neurogenic claudication: Secondary | ICD-10-CM | POA: Diagnosis not present

## 2021-03-31 DIAGNOSIS — M48062 Spinal stenosis, lumbar region with neurogenic claudication: Secondary | ICD-10-CM

## 2021-03-31 DIAGNOSIS — M545 Low back pain, unspecified: Secondary | ICD-10-CM | POA: Diagnosis not present

## 2021-04-11 ENCOUNTER — Ambulatory Visit: Payer: Medicare Other | Admitting: Cardiovascular Disease

## 2021-04-17 ENCOUNTER — Ambulatory Visit: Payer: Medicare Other | Admitting: Cardiovascular Disease

## 2021-04-18 DIAGNOSIS — J029 Acute pharyngitis, unspecified: Secondary | ICD-10-CM | POA: Diagnosis not present

## 2021-04-18 DIAGNOSIS — R059 Cough, unspecified: Secondary | ICD-10-CM | POA: Diagnosis not present

## 2021-04-18 DIAGNOSIS — Z03818 Encounter for observation for suspected exposure to other biological agents ruled out: Secondary | ICD-10-CM | POA: Diagnosis not present

## 2021-04-29 NOTE — Progress Notes (Signed)
Call and spoke with pt about her blood work, pt stated she haven't been taking cholesterol medication(Repatha) for a while because of some side effects, and she will like to get back on some kind of regimen for her cholesterol, appt made with pharmacist on 06/23 @ 8:00am.

## 2021-05-05 ENCOUNTER — Telehealth: Payer: Self-pay

## 2021-05-05 DIAGNOSIS — G5603 Carpal tunnel syndrome, bilateral upper limbs: Secondary | ICD-10-CM | POA: Diagnosis not present

## 2021-05-05 DIAGNOSIS — E7800 Pure hypercholesterolemia, unspecified: Secondary | ICD-10-CM

## 2021-05-05 NOTE — Telephone Encounter (Signed)
Called and lmomed the pt to let her know the 6/23 appt isn't needed if she is already on repatha

## 2021-05-06 MED ORDER — REPATHA SURECLICK 140 MG/ML ~~LOC~~ SOAJ: 140.0000 mg | SUBCUTANEOUS | 3 refills | Status: DC

## 2021-05-06 NOTE — Telephone Encounter (Signed)
PT CALLED IN TO SAY THAT SHE WANTED TO RESTART REPATHA ALREADY HAS APPROVAL ON FILE SO RX SENT AND INSTRUCTED THE PT TO COMPLETE FASTING LABS POST 4TH DOSE

## 2021-05-06 NOTE — Addendum Note (Signed)
Addended by: Allean Found on: 05/06/2021 09:43 AM   Modules accepted: Orders

## 2021-05-07 DIAGNOSIS — I10 Essential (primary) hypertension: Secondary | ICD-10-CM | POA: Diagnosis not present

## 2021-05-07 DIAGNOSIS — G5603 Carpal tunnel syndrome, bilateral upper limbs: Secondary | ICD-10-CM | POA: Diagnosis not present

## 2021-05-07 DIAGNOSIS — Z6832 Body mass index (BMI) 32.0-32.9, adult: Secondary | ICD-10-CM | POA: Diagnosis not present

## 2021-05-07 DIAGNOSIS — M48062 Spinal stenosis, lumbar region with neurogenic claudication: Secondary | ICD-10-CM | POA: Diagnosis not present

## 2021-05-08 ENCOUNTER — Ambulatory Visit: Payer: TRICARE For Life (TFL)

## 2021-05-12 NOTE — Progress Notes (Signed)
This encounter was created in error - please disregard.

## 2021-06-12 ENCOUNTER — Other Ambulatory Visit: Payer: Self-pay | Admitting: Gastroenterology

## 2021-06-12 DIAGNOSIS — R1310 Dysphagia, unspecified: Secondary | ICD-10-CM | POA: Diagnosis not present

## 2021-06-12 DIAGNOSIS — R1013 Epigastric pain: Secondary | ICD-10-CM | POA: Diagnosis not present

## 2021-06-12 DIAGNOSIS — Z86010 Personal history of colonic polyps: Secondary | ICD-10-CM | POA: Diagnosis not present

## 2021-06-12 DIAGNOSIS — R1011 Right upper quadrant pain: Secondary | ICD-10-CM

## 2021-06-12 DIAGNOSIS — K921 Melena: Secondary | ICD-10-CM | POA: Diagnosis not present

## 2021-06-16 ENCOUNTER — Ambulatory Visit
Admission: RE | Admit: 2021-06-16 | Discharge: 2021-06-16 | Disposition: A | Discharge status: Home / Self Care | Payer: Medicare Other | Source: Ambulatory Visit | Attending: Gastroenterology | Admitting: Gastroenterology

## 2021-06-16 ENCOUNTER — Other Ambulatory Visit: Payer: Self-pay

## 2021-06-16 DIAGNOSIS — R1011 Right upper quadrant pain: Secondary | ICD-10-CM

## 2021-07-02 ENCOUNTER — Other Ambulatory Visit: Payer: Self-pay

## 2021-07-02 ENCOUNTER — Ambulatory Visit (INDEPENDENT_AMBULATORY_CARE_PROVIDER_SITE_OTHER): Payer: Medicare Other | Admitting: Cardiovascular Disease

## 2021-07-02 ENCOUNTER — Encounter (HOSPITAL_BASED_OUTPATIENT_CLINIC_OR_DEPARTMENT_OTHER): Payer: Self-pay | Admitting: Cardiovascular Disease

## 2021-07-02 VITALS — BP 110/60 | HR 57 | Ht 60.0 in | Wt 168.2 lb

## 2021-07-02 DIAGNOSIS — I10 Essential (primary) hypertension: Secondary | ICD-10-CM

## 2021-07-02 DIAGNOSIS — G459 Transient cerebral ischemic attack, unspecified: Secondary | ICD-10-CM | POA: Diagnosis not present

## 2021-07-02 DIAGNOSIS — I2510 Atherosclerotic heart disease of native coronary artery without angina pectoris: Secondary | ICD-10-CM | POA: Diagnosis not present

## 2021-07-02 DIAGNOSIS — E7800 Pure hypercholesterolemia, unspecified: Secondary | ICD-10-CM

## 2021-07-02 HISTORY — DX: Essential (primary) hypertension: I10

## 2021-07-02 NOTE — Patient Instructions (Signed)
Medication Instructions:  Your physician recommends that you continue on your current medications as directed. Please refer to the Current Medication list given to you today.   *If you need a refill on your cardiac medications before your next appointment, please call your pharmacy*  Lab Work: NONE  Testing/Procedures: NONE  Follow-Up: At [facility], you and your health needs are our priority.  As part of our continuing mission to provide you with exceptional heart care, we have created designated Provider Care Teams.  These Care Teams include your primary Cardiologist (physician) and Advanced Practice Providers (APPs -  Physician Assistants and Nurse Practitioners) who all work together to provide you with the care you need, when you need it.  We recommend signing up for the patient portal called "MyChart".  Sign up information is provided on this After Visit Summary.  MyChart is used to connect with patients for Virtual Visits (Telemedicine).  Patients are able to view lab/test results, encounter notes, upcoming appointments, etc.  Non-urgent messages can be sent to your provider as well.   To learn more about what you can do with MyChart, go to [email or web address].    Your next appointment:   6 month(s)  The format for your next appointment:   In Person  Provider:   Skeet Latch, MD Laurann Montana, NP

## 2021-07-02 NOTE — Assessment & Plan Note (Signed)
Asymptomatic.  She is doing well.  She is back on Repatha.  Repeat lipids and CMP after her next dose. LDL goal <70.

## 2021-07-02 NOTE — Assessment & Plan Note (Signed)
BP well-controlled on amlodipine.

## 2021-07-02 NOTE — Assessment & Plan Note (Signed)
Continue Repatha and aspirin.

## 2021-07-02 NOTE — Progress Notes (Signed)
Cardiology Office Note   Date:  07/02/2021   ID:  NOVALY LOOKABAUGH, DOB [birth year], MRN [id number]  PCP:  Johna Roles, PA  Cardiologist:   Skeet Latch, MD   No chief complaint on file.    History of Present Illness: [name] is a 75 y.o. female with familial hyperlipidemia, hypertension, TIA, Graves disease, and depression who presents for follow up.  [name] reports several years of hyperlipidemia. [name] has previously tried atorvastatin and rosuvastatin but developed chest pain and shortness of breath with these medications. During that time [name] underwent cardiac catheterization in 2011 that reportedly showed mild CAD. [name] was also noted to have a murmur in 2014 and had an echo that was reportedly normal.  [name] was started on pravastatin '5mg'$  three times per week.  [name] followed up with our pharmacist and was tolerating it well but her lipids were not controlled so it was increased and [name] developed myalgias. [name] was then started on Repatha and has been tolerating it well.    [name] was seen in the ED 01/11/2020 for an episode of angina pectoris. [name] reported exertional fatigue. [name] had a Coronary CT 12/2019 that had significant disease in the left circ OM1 and LAD. FFR was abnormal in the mid LAD. [name] had a cardiac cath at which time [name] was found to have moderate disease in left circumflex and moderate LAD with no obstructive disease. [name] followed up with Coletta Memos, NP, on 01/25/2020 and was doing well.  Today, [name] is doing great and states [name] can't complain. However, [name] does feel "sluggish" overall, but denies feeling lightheaded or dizzy. At home [name] has not monitored her blood pressure in a while. Occasionally, [name] will develop LE edema, and will try to massage it away. For exercise, her most strenuous workout is walking around [place] for groceries. By the end [name] is very limited and suffering from her leg pain and numbness. [name] has also started to go dancing again,  but notes [name] feels fatigued the next day. [name] reports that recent gallbladder and liver scans were normal. Also [name] is scheduled for a colonoscopy in October, indicated by intermittent right abdominal pain after eating. Her regimen now includes Repatha, and [name] will take her fourth dose this week 8/19. [name] discontinued her vitamin D supplement. [name] denies any palpitations, chest pain, or shortness of breath. No headaches, syncope, orthopnea, or PND.     Past Medical History:  Diagnosis Date   Colonoscopy causing post-procedural bleeding    Coronary artery calcification seen on computed tomography    Endometriosis    Essential hypertension 07/02/2021   Essential hypertension 07/02/2021   Hyperlipidemia    Myalgia 11/20/2016   Obesity 08/25/2016   Shortness of breath 07/21/2017   Thyroid disease    TIA (transient ischemic attack) 07/21/2017    Past Surgical History:  Procedure Laterality Date   CARDIAC CATHETERIZATION     INTRAVASCULAR PRESSURE WIRE/FFR STUDY N/A 01/11/2020   Procedure: INTRAVASCULAR PRESSURE WIRE/FFR STUDY;  Surgeon: Jettie Booze, MD;  Location: [facility];  Service: Cardiovascular;  Laterality: N/A;   LEFT HEART CATH AND CORONARY ANGIOGRAPHY N/A 01/11/2020   Procedure: LEFT HEART CATH AND CORONARY ANGIOGRAPHY;  Surgeon: Jettie Booze, MD;  Location: [facility];  Service: Cardiovascular;  Laterality: N/A;     Current Outpatient Medications  Medication Sig Dispense Refill   amLODipine (NORVASC) 5 MG tablet TAKE 1 TABLET BY MOUTH DAILY (Patient taking  differently: Take 5 mg by mouth daily.) 90 tablet 2   Evolocumab (REPATHA SURECLICK) XX123456 MG/ML SOAJ Inject 140 mg into the skin every 14 (fourteen) days. 6 mL 3   levothyroxine (SYNTHROID, LEVOTHROID) 88 MCG tablet Take 88 mcg by mouth daily before breakfast.     sertraline (ZOLOFT) 25 MG tablet Take 25 mg by mouth daily.     No current facility-administered medications for this visit.     Allergies:   Atorvastatin, Codeine, Demerol [meperidine], Penicillins, Pravastatin, and Rosuvastatin    Social History:  The patient  reports that [name] has quit smoking. Her smoking use included cigarettes. [name] smoked an average of 1 pack per day. [name] has never used smokeless tobacco. [name] reports current alcohol use. [name] reports that [name] does not use drugs.   Family History:  The patient's  family history includes CAD in her father, mother, and sister; Dementia in her brother and mother; Kidney disease in her brother; Lymphoma in her sister.    ROS:   Please see the history of present illness. (+) Fatigue (+) Bilateral LE pain and numbness (+) Bilateral LE edema All other systems are reviewed and negative.    PHYSICAL EXAM: VS:  BP 110/60   Pulse (!) 57   Ht 5' (1.524 m)   Wt 168 lb 3.2 oz (76.3 kg)   BMI 32.85 kg/m  , BMI Body mass index is 32.85 kg/m. GENERAL:  Well appearing HEENT: Pupils equal round and reactive, fundi not visualized, oral mucosa unremarkable NECK:  No jugular venous distention, waveform within normal limits, carotid upstroke brisk and symmetric, no bruits LUNGS:  Clear to auscultation bilaterally HEART:  RRR.  PMI not displaced or sustained,S1 and S2 within normal limits, no S3, no S4, no clicks, no rubs, no murmurs ABD:  Flat, positive bowel sounds normal in frequency in pitch, no bruits, no rebound, no guarding, no midline pulsatile mass, no hepatomegaly, no splenomegaly EXT:  2 plus pulses throughout, no edema, no cyanosis no clubbing SKIN:  No rashes no nodules NEURO:  Cranial nerves II through XII grossly intact, motor grossly intact throughout PSYCH:  Cognitively intact, oriented to person place and time   EKG:   07/02/2021: Sinus bradycardia. Rate 57 bpm. Low voltage.  11/15/19: Sinus rhythm rate 61 bpm.    11/10/18: Sinus bradycardia.  Rate 58 bmp.  01/28/18: Sinus bradycardia.  Rate 58 bpm.   07/21/17: Sinus rhythm.  Rate 60 bpm.   08/25/16:  sinus rhythm. Rate 60 bpm. Nonspecific T wave abnormalities.  LHC 01/11/2020: Prox Cx lesion is 50% stenosed. Not significant by DFR, 1.0. Mid LAD lesion is 40% stenosed, not significant by FFR with IV adenosine, 0.83. The left ventricular systolic function is normal. LV end diastolic pressure is normal. The left ventricular ejection fraction is 55-65% by visual estimate. There is no aortic valve stenosis.   Continue aggressive risk factor modification.  Plan discharge later today.  Coronary CTA 01/02/2020: IMPRESSION: 1. Coronary calcium score of 741. This was 90th percentile for age and sex matched control.   2. Normal coronary origin with co-dominance.   3. Heavily calcified vessels with concern for obstructive disease in the proximal LCX, OM1 (>70%) and moderate disease (50-69% in the proximal LAD and D2.   4.  Will send study for FFRct.   5.  Mild calcification of the aortic root.  Recent Labs: No results found for requested labs within last 8760 hours.    Lipid Panel    Component Value  Date/Time   CHOL 195 10/24/2018 0827   TRIG 190 (H) 10/24/2018 0827   HDL 66 10/24/2018 0827   CHOLHDL 3.0 10/24/2018 0827   CHOLHDL 5.3 (H) 12/28/2016 0924   VLDL 49 (H) 12/28/2016 0924   LDLCALC 91 10/24/2018 0827   06/03/16:  WBC 5.2, hemoglobin of 0.4, hematocrit 37.4, platelets 222 Sodium 140, potassium 4.3 BUN 20, creatinine 0.96 AST 24, ALT 22 TSH 0.37 Total cholesterol 341, triglycerides 334, HDL 58, LDL 217  Wt Readings from Last 3 Encounters:  07/02/21 168 lb 3.2 oz (76.3 kg)  01/25/20 170 lb 9.6 oz (77.4 kg)  01/11/20 173 lb (78.5 kg)      ASSESSMENT AND PLAN: Coronary artery calcification seen on computed tomography Asymptomatic.  [name] is doing well.  [name] is back on Repatha.  Repeat lipids and CMP after her next dose. LDL goal <70.  Essential hypertension BP well-controlled on amlodipine.  TIA (transient ischemic attack) Continue Repatha and  aspirin.   Current medicines are reviewed at length with the patient today.  The patient does not have concerns regarding medicines.  The following changes have been made: none  Labs/ tests ordered today include:   Orders Placed This Encounter  Procedures   EKG 12-Lead      Disposition:   FU with Avan Gullett C. Oval Linsey, MD, [facility] in 6 months.  I,Mathew Stumpf,acting as a Education administrator for Skeet Latch, MD.,have documented all relevant documentation on the behalf of Skeet Latch, MD,as directed by  Skeet Latch, MD while in the presence of Skeet Latch, MD.  I, Golinda Oval Linsey, MD have reviewed all documentation for this visit.  The documentation of the exam, diagnosis, procedures, and orders on 07/02/2021 are all accurate and complete.   Signed, Milen Lengacher C. Oval Linsey, MD, [facility]  07/02/2021 10:07 PM    [place]

## 2021-07-07 DIAGNOSIS — E7800 Pure hypercholesterolemia, unspecified: Secondary | ICD-10-CM | POA: Diagnosis not present

## 2021-07-08 LAB — HEPATIC FUNCTION PANEL
ALT: 13 IU/L (ref 0–32)
AST: 18 IU/L (ref 0–40)
Albumin: 4 g/dL (ref 3.7–4.7)
Alkaline Phosphatase: 113 IU/L (ref 44–121)
Bilirubin Total: <0.2 mg/dL (ref 0.0–1.2)
Bilirubin, Direct: <0.1 mg/dL (ref 0.00–0.40)
Total Protein: 6.7 g/dL (ref 6.0–8.5)

## 2021-07-08 LAB — LIPID PANEL
Chol/HDL Ratio: 4.1 ratio (ref 0.0–4.4)
Cholesterol, Total: 215 mg/dL — ABNORMAL HIGH (ref 100–199)
HDL: 53 mg/dL (ref >39)
LDL Chol Calc (NIH): 116 mg/dL — ABNORMAL HIGH (ref 0–99)
Triglycerides: 268 mg/dL — ABNORMAL HIGH (ref 0–149)
VLDL Cholesterol Cal: 46 mg/dL — ABNORMAL HIGH (ref 5–40)

## 2021-07-17 ENCOUNTER — Other Ambulatory Visit (HOSPITAL_BASED_OUTPATIENT_CLINIC_OR_DEPARTMENT_OTHER): Payer: Self-pay | Admitting: *Deleted

## 2021-07-17 DIAGNOSIS — E7800 Pure hypercholesterolemia, unspecified: Secondary | ICD-10-CM

## 2021-07-24 DIAGNOSIS — R208 Other disturbances of skin sensation: Secondary | ICD-10-CM | POA: Diagnosis not present

## 2021-08-04 ENCOUNTER — Telehealth: Payer: Self-pay

## 2021-08-04 MED ORDER — REPATHA SURECLICK 140 MG/ML ~~LOC~~ SOAJ: 140.0000 mg | SUBCUTANEOUS | 3 refills | Status: DC

## 2021-08-04 NOTE — Telephone Encounter (Signed)
Patient returned call, states [facility] is still waiting for prescription refill.   Resent today.

## 2021-08-04 NOTE — Telephone Encounter (Signed)
Patient called and has a question regarding her Repatha.  She would like to speak with a Pharmacist.

## 2021-08-28 ENCOUNTER — Telehealth (HOSPITAL_BASED_OUTPATIENT_CLINIC_OR_DEPARTMENT_OTHER): Payer: Self-pay | Admitting: Cardiovascular Disease

## 2021-08-28 DIAGNOSIS — I10 Essential (primary) hypertension: Secondary | ICD-10-CM

## 2021-08-28 DIAGNOSIS — E7800 Pure hypercholesterolemia, unspecified: Secondary | ICD-10-CM

## 2021-08-28 NOTE — Telephone Encounter (Signed)
Pt c/o medication issue:  1. Name of Medication: Evolocumab (REPATHA SURECLICK) 075 MG/ML SOAJ  2. How are you currently taking this medication (dosage and times per day)? TAKE 1 TABLET BY MOUTH DAILYPatient taking differently: Take 5 mg by mouth daily.  3. Are you having a reaction (difficulty breathing--STAT)? no  4. What is your medication issue? Call needing a prior authorization for medication. Fax [phone]

## 2021-08-29 ENCOUNTER — Other Ambulatory Visit: Payer: Self-pay | Admitting: Pharmacist

## 2021-08-29 MED ORDER — REPATHA SURECLICK 140 MG/ML ~~LOC~~ SOAJ: 140.0000 mg | SUBCUTANEOUS | 3 refills | Status: DC

## 2021-08-29 NOTE — Telephone Encounter (Signed)
PA form submitted

## 2021-08-29 NOTE — Telephone Encounter (Signed)
Patient returned call

## 2021-08-29 NOTE — Telephone Encounter (Signed)
Advised patient, verbalized understanding She has been off the [place] since first of September  She will keep follow ups as scheduled

## 2021-08-29 NOTE — Telephone Encounter (Signed)
PA approved through 08/29/22, refill sent to pharmacy.

## 2021-08-29 NOTE — Telephone Encounter (Signed)
[name] - do you know why she has been off [place]. Looks like PharmD team has been seeing her and she is a new lipid patient for [place] in March. If labs >6 months, would need lipids (NMR lipoprofile and LP(a)) before her visit with [facility] MD

## 2021-08-29 NOTE — Telephone Encounter (Signed)
Advised patient of needed labs Mailed lab orders

## 2021-08-29 NOTE — Telephone Encounter (Signed)
Left message to call back  

## 2021-08-29 NOTE — Addendum Note (Signed)
Addended by: Alvina Filbert B on: 08/29/2021 04:26 PM   Modules accepted: Orders

## 2021-08-29 NOTE — Addendum Note (Signed)
Addended by: Alvina Filbert B on: 08/29/2021 05:27 PM   Modules accepted: Orders

## 2021-09-02 DIAGNOSIS — N189 Chronic kidney disease, unspecified: Secondary | ICD-10-CM | POA: Diagnosis not present

## 2021-09-02 DIAGNOSIS — N1831 Chronic kidney disease, stage 3a: Secondary | ICD-10-CM | POA: Diagnosis not present

## 2021-09-02 DIAGNOSIS — E785 Hyperlipidemia, unspecified: Secondary | ICD-10-CM | POA: Diagnosis not present

## 2021-09-02 DIAGNOSIS — I129 Hypertensive chronic kidney disease with stage 1 through stage 4 chronic kidney disease, or unspecified chronic kidney disease: Secondary | ICD-10-CM | POA: Diagnosis not present

## 2021-09-02 DIAGNOSIS — N390 Urinary tract infection, site not specified: Secondary | ICD-10-CM | POA: Diagnosis not present

## 2021-09-03 ENCOUNTER — Telehealth: Payer: Self-pay | Admitting: Cardiovascular Disease

## 2021-09-03 DIAGNOSIS — R609 Edema, unspecified: Secondary | ICD-10-CM | POA: Diagnosis not present

## 2021-09-03 DIAGNOSIS — E7800 Pure hypercholesterolemia, unspecified: Secondary | ICD-10-CM | POA: Diagnosis not present

## 2021-09-03 DIAGNOSIS — M79671 Pain in right foot: Secondary | ICD-10-CM | POA: Diagnosis not present

## 2021-09-03 DIAGNOSIS — R011 Cardiac murmur, unspecified: Secondary | ICD-10-CM | POA: Diagnosis not present

## 2021-09-03 DIAGNOSIS — I10 Essential (primary) hypertension: Secondary | ICD-10-CM | POA: Diagnosis not present

## 2021-09-03 MED ORDER — REPATHA SURECLICK 140 MG/ML ~~LOC~~ SOAJ: 140.0000 mg | SUBCUTANEOUS | 3 refills | Status: DC

## 2021-09-03 NOTE — Telephone Encounter (Signed)
Called patient, advised of previous messages that patient stopped Reptha in September, she is due to see [facility], and follow up with Dr.Hilty. Advised them to call back if further questions- but we would figure out what was going on with the [place].

## 2021-09-03 NOTE — Telephone Encounter (Signed)
Called and spoke to pt and they stated that they haven't received any repatha injections since 07/18/21. They also stated that they had been getting charged and not receiving medication. I decided to 3way call the pt to get clarity on the situation. They stated that they were overdue for a pa and that they had now gotten the pa and they are in the process of shipping it out soon. So the pt decided to cancel that rx and have it sent to [facility].

## 2021-09-03 NOTE — Addendum Note (Signed)
Addended by: Allean Found on: 09/03/2021 04:34 PM   Modules accepted: Orders

## 2021-09-03 NOTE — Telephone Encounter (Signed)
Pt c/o medication issue:  1. Name of Medication: repatha  2. How are you currently taking this medication (dosage and times per day)? Not currently taking  3. Are you having a reaction (difficulty breathing--STAT)? no  4. What is your medication issue? Rose from [facility], [place], would like to know why the patient is not on repatha. She says the patient has been trying since April 22 and has [facility], but does not have the medication yet. Phone: [phone]

## 2021-09-03 NOTE — Telephone Encounter (Signed)
Haleigh can you call to see why pharmacy isn't sending Repatha to pt and follow up with pt? Already has an active prior auth on file and refill sent in.

## 2021-09-08 DIAGNOSIS — M79671 Pain in right foot: Secondary | ICD-10-CM | POA: Diagnosis not present

## 2021-09-24 DIAGNOSIS — E039 Hypothyroidism, unspecified: Secondary | ICD-10-CM | POA: Diagnosis not present

## 2021-09-24 DIAGNOSIS — R0989 Other specified symptoms and signs involving the circulatory and respiratory systems: Secondary | ICD-10-CM | POA: Diagnosis not present

## 2021-09-24 DIAGNOSIS — I10 Essential (primary) hypertension: Secondary | ICD-10-CM | POA: Diagnosis not present

## 2021-09-26 DIAGNOSIS — E039 Hypothyroidism, unspecified: Secondary | ICD-10-CM | POA: Diagnosis not present

## 2021-09-26 DIAGNOSIS — F458 Other somatoform disorders: Secondary | ICD-10-CM | POA: Diagnosis not present

## 2021-09-26 DIAGNOSIS — E041 Nontoxic single thyroid nodule: Secondary | ICD-10-CM | POA: Diagnosis not present

## 2021-09-26 DIAGNOSIS — R0989 Other specified symptoms and signs involving the circulatory and respiratory systems: Secondary | ICD-10-CM | POA: Diagnosis not present

## 2021-10-03 ENCOUNTER — Other Ambulatory Visit: Payer: Self-pay | Admitting: Physician Assistant

## 2021-10-03 DIAGNOSIS — E041 Nontoxic single thyroid nodule: Secondary | ICD-10-CM

## 2021-10-13 DIAGNOSIS — M7661 Achilles tendinitis, right leg: Secondary | ICD-10-CM | POA: Diagnosis not present

## 2021-10-14 ENCOUNTER — Ambulatory Visit
Admission: RE | Admit: 2021-10-14 | Discharge: 2021-10-14 | Disposition: A | Discharge status: Home / Self Care | Payer: Medicare Other | Source: Ambulatory Visit | Attending: Physician Assistant | Admitting: Physician Assistant

## 2021-10-14 DIAGNOSIS — E041 Nontoxic single thyroid nodule: Secondary | ICD-10-CM

## 2021-10-29 ENCOUNTER — Other Ambulatory Visit (HOSPITAL_COMMUNITY)
Admission: RE | Admit: 2021-10-29 | Discharge: 2021-10-29 | Disposition: A | Discharge status: Home / Self Care | Payer: Medicare Other | Source: Ambulatory Visit | Attending: Physician Assistant | Admitting: Physician Assistant

## 2021-10-29 ENCOUNTER — Ambulatory Visit
Admission: RE | Admit: 2021-10-29 | Discharge: 2021-10-29 | Disposition: A | Discharge status: Home / Self Care | Payer: Medicare Other | Source: Ambulatory Visit | Attending: Physician Assistant | Admitting: Physician Assistant

## 2021-10-29 DIAGNOSIS — E041 Nontoxic single thyroid nodule: Secondary | ICD-10-CM | POA: Diagnosis not present

## 2021-10-30 LAB — CYTOLOGY - NON PAP

## 2021-11-03 ENCOUNTER — Other Ambulatory Visit: Payer: Self-pay | Admitting: Physician Assistant

## 2021-11-03 DIAGNOSIS — E041 Nontoxic single thyroid nodule: Secondary | ICD-10-CM

## 2021-11-25 ENCOUNTER — Other Ambulatory Visit (HOSPITAL_COMMUNITY)
Admission: RE | Admit: 2021-11-25 | Discharge: 2021-11-25 | Disposition: A | Discharge status: Home / Self Care | Payer: Medicare Other | Source: Ambulatory Visit | Attending: Interventional Radiology | Admitting: Interventional Radiology

## 2021-11-25 ENCOUNTER — Ambulatory Visit
Admission: RE | Admit: 2021-11-25 | Discharge: 2021-11-25 | Disposition: A | Discharge status: Home / Self Care | Payer: Medicare Other | Source: Ambulatory Visit | Attending: Physician Assistant | Admitting: Physician Assistant

## 2021-11-25 DIAGNOSIS — E041 Nontoxic single thyroid nodule: Secondary | ICD-10-CM

## 2021-11-25 DIAGNOSIS — D440 Neoplasm of uncertain behavior of thyroid gland: Secondary | ICD-10-CM | POA: Insufficient documentation

## 2021-11-25 DIAGNOSIS — E079 Disorder of thyroid, unspecified: Secondary | ICD-10-CM | POA: Diagnosis not present

## 2021-11-27 LAB — CYTOLOGY - NON PAP

## 2022-01-06 DIAGNOSIS — E041 Nontoxic single thyroid nodule: Secondary | ICD-10-CM | POA: Diagnosis not present

## 2022-01-06 DIAGNOSIS — E0500 Thyrotoxicosis with diffuse goiter without thyrotoxic crisis or storm: Secondary | ICD-10-CM | POA: Diagnosis not present

## 2022-01-15 DIAGNOSIS — E7800 Pure hypercholesterolemia, unspecified: Secondary | ICD-10-CM | POA: Diagnosis not present

## 2022-01-15 DIAGNOSIS — I10 Essential (primary) hypertension: Secondary | ICD-10-CM | POA: Diagnosis not present

## 2022-01-16 LAB — NMR, LIPOPROFILE
Cholesterol, Total: 216 mg/dL — ABNORMAL HIGH (ref 100–199)
HDL Particle Number: 33.1 umol/L (ref >=30.5)
HDL-C: 57 mg/dL (ref >39)
LDL Particle Number: 1221 nmol/L — ABNORMAL HIGH (ref <1000)
LDL Size: 21.3 nm (ref >20.5)
LDL-C (NIH Calc): 124 mg/dL — ABNORMAL HIGH (ref 0–99)
LP-IR Score: <25 (ref <=45)
Small LDL Particle Number: 344 nmol/L (ref <=527)
Triglycerides: 197 mg/dL — ABNORMAL HIGH (ref 0–149)

## 2022-01-16 LAB — LIPOPROTEIN A (LPA): Lipoprotein (a): 101.3 nmol/L — ABNORMAL HIGH (ref <75.0)

## 2022-02-02 DIAGNOSIS — K222 Esophageal obstruction: Secondary | ICD-10-CM | POA: Diagnosis not present

## 2022-02-02 DIAGNOSIS — Z86010 Personal history of colonic polyps: Secondary | ICD-10-CM | POA: Diagnosis not present

## 2022-02-02 DIAGNOSIS — K2289 Other specified disease of esophagus: Secondary | ICD-10-CM | POA: Diagnosis not present

## 2022-02-02 DIAGNOSIS — R1013 Epigastric pain: Secondary | ICD-10-CM | POA: Diagnosis not present

## 2022-02-02 DIAGNOSIS — R1310 Dysphagia, unspecified: Secondary | ICD-10-CM | POA: Diagnosis not present

## 2022-02-02 DIAGNOSIS — R1011 Right upper quadrant pain: Secondary | ICD-10-CM | POA: Diagnosis not present

## 2022-02-02 DIAGNOSIS — K2930 Chronic superficial gastritis without bleeding: Secondary | ICD-10-CM | POA: Diagnosis not present

## 2022-02-02 DIAGNOSIS — D123 Benign neoplasm of transverse colon: Secondary | ICD-10-CM | POA: Diagnosis not present

## 2022-02-02 DIAGNOSIS — K648 Other hemorrhoids: Secondary | ICD-10-CM | POA: Diagnosis not present

## 2022-02-05 ENCOUNTER — Other Ambulatory Visit: Payer: Self-pay

## 2022-02-05 ENCOUNTER — Ambulatory Visit (INDEPENDENT_AMBULATORY_CARE_PROVIDER_SITE_OTHER): Payer: Medicare Other | Admitting: Cardiovascular Disease

## 2022-02-05 ENCOUNTER — Encounter (HOSPITAL_BASED_OUTPATIENT_CLINIC_OR_DEPARTMENT_OTHER): Payer: Self-pay | Admitting: Cardiovascular Disease

## 2022-02-05 VITALS — BP 134/66 | HR 60 | Ht 60.0 in | Wt 172.4 lb

## 2022-02-05 DIAGNOSIS — G459 Transient cerebral ischemic attack, unspecified: Secondary | ICD-10-CM

## 2022-02-05 DIAGNOSIS — E7800 Pure hypercholesterolemia, unspecified: Secondary | ICD-10-CM | POA: Diagnosis not present

## 2022-02-05 DIAGNOSIS — I2510 Atherosclerotic heart disease of native coronary artery without angina pectoris: Secondary | ICD-10-CM

## 2022-02-05 DIAGNOSIS — I10 Essential (primary) hypertension: Secondary | ICD-10-CM | POA: Diagnosis not present

## 2022-02-05 MED ORDER — AMLODIPINE BESYLATE 2.5 MG PO TABS: 2.5000 mg | ORAL_TABLET | Freq: Every day | ORAL | 3 refills | Status: AC

## 2022-02-05 NOTE — Assessment & Plan Note (Addendum)
Moderate disease in the left circumflex and LAD.  She has started increasing her exercise and has no exertional symptoms.  We will have her start taking aspirin 81 mg daily.  Lipids are not at goal.  However she has had issues with getting her Repatha prescription.  She now has what she needs.  Will not make any medication changes at this time. ?

## 2022-02-05 NOTE — Assessment & Plan Note (Signed)
Blood pressure is reasonably well controlled.  She reduced her amlodipine because she was feeling lightheaded and having low blood pressures.  She has not been checking her blood pressure regularly.  She will check it twice daily and make sure that it is staying under 130/80.  Continue amlodipine 2.5 mg for now. ?

## 2022-02-05 NOTE — Assessment & Plan Note (Signed)
Continue Repatha.  She was not taking it consistently when her lipids were last checked.  We will repeat lipids in 6 months.  LP(a) was elevated. ?

## 2022-02-05 NOTE — Assessment & Plan Note (Signed)
Continue Repatha.  Adding aspirin as above. ?

## 2022-02-05 NOTE — Progress Notes (Signed)
? ?Cardiology Office Note ? ?Date:  02/05/2022  ? ?ID:  [name], DOB [birth year], MRN [id number] ? ?PCP:  [name] Roles, PA  ?Cardiologist:   [name] Latch, MD  ? ?No chief complaint on file. ? ?History of Present Illness: ?[name] is a 76 y.o. female with familial hyperlipidemia, nonobstructive CAD, hypertension, TIA, Graves disease, and depression who presents for follow up.  [name] reports several years of hyperlipidemia. She has previously tried atorvastatin and rosuvastatin but developed chest pain and shortness of breath with these medications. During that time she underwent cardiac catheterization in 2011 that reportedly showed mild CAD. She was also noted to have a murmur in 2014 and had an echo that was reportedly normal.  She was started on pravastatin '5mg'$  three times per week.  She followed up with our pharmacist and was tolerating it well but her lipids were not controlled so it was increased and she developed myalgias. She was then started on Repatha and has been tolerating it well.  She was seen in the ED 01/11/2020 for an episode of angina pectoris. She reported exertional fatigue. She had a Coronary CT 12/2019 that had significant disease in the left circ OM1 and LAD. FFR was abnormal in the mid LAD. She had a cardiac cath at which time she was found to have moderate disease in left circumflex and moderate LAD with no obstructive disease. She followed up with [name] Memos, NP, on 01/25/2020 and was doing well. ? ?Today, she is feeling good overall. She reports a recent colonoscopy that revealed a polyp, likely benign. In 10/2021 she had a needle biopsy of her thyroid with normal results. Also, she developed lightheadedness when taking a full tablet of amlodipine. She has reduced this to 1/2 a tablet, and seems to be tolerating this lower dose. She is not monitoring her blood pressure at home. For exercise she has placed her bike on a stationary stand, and tries to ride every  day. Currently she is up to 4 days a week for 10 minutes a day. Usually she feels okay while exercising. Her breathing has been good. Since her last visit she restarted Repatha around October or November 2022. She notes missing a few doses due to delivery and co-pay issues. Lately she has been on schedule. She can tell a significant difference in how she feels compared to the time when she stopped her Repatha. She denies any palpitations, chest pain, shortness of breath, or peripheral edema. No headaches, syncope, orthopnea, or PND. ? ? ?Past Medical History:  ?Diagnosis Date  ? Colonoscopy causing post-procedural bleeding   ? Coronary artery calcification seen on computed tomography   ? Endometriosis   ? Essential hypertension 07/02/2021  ? Essential hypertension 07/02/2021  ? Hyperlipidemia   ? Myalgia 11/20/2016  ? Obesity 08/25/2016  ? Shortness of breath 07/21/2017  ? Thyroid disease   ? TIA (transient ischemic attack) 07/21/2017  ? ? ?Past Surgical History:  ?Procedure Laterality Date  ? CARDIAC CATHETERIZATION    ? INTRAVASCULAR PRESSURE WIRE/FFR STUDY N/A 01/11/2020  ? Procedure: INTRAVASCULAR PRESSURE WIRE/FFR STUDY;  Surgeon: [name] Booze, MD;  Location: [facility];  Service: Cardiovascular;  Laterality: N/A;  ? LEFT HEART CATH AND CORONARY ANGIOGRAPHY N/A 01/11/2020  ? Procedure: LEFT HEART CATH AND CORONARY ANGIOGRAPHY;  Surgeon: [name] Booze, MD;  Location: [facility];  Service: Cardiovascular;  Laterality: N/A;  ? ? ? ?Current Outpatient Medications  ?Medication Sig Dispense Refill  ?  aspirin EC 81 MG tablet Take 81 mg by mouth daily. Swallow whole.    ? Evolocumab (REPATHA SURECLICK) 811 MG/ML SOAJ Inject 140 mg into the skin every 14 (fourteen) days. 6 mL 3  ? levothyroxine (SYNTHROID, LEVOTHROID) 88 MCG tablet Take 88 mcg by mouth daily before breakfast.    ? sertraline (ZOLOFT) 25 MG tablet Take 25 mg by mouth daily.    ? amLODipine (NORVASC) 2.5 MG tablet Take 1 tablet (2.5  mg total) by mouth daily. 90 tablet 3  ? ?No current facility-administered medications for this visit.  ? ? ?Allergies:   Atorvastatin, Codeine, Demerol [meperidine], Penicillins, Pravastatin, and Rosuvastatin  ? ? ?Social History:  The patient  reports that she has quit smoking. Her smoking use included cigarettes. She smoked an average of 1 pack per day. She has never used smokeless tobacco. She reports current alcohol use. She reports that she does not use drugs.  ? ?Family History:  The patient's  ?family history includes CAD in her father, mother, and sister; Dementia in her brother and mother; Kidney disease in her brother; Lymphoma in her sister.  ? ? ?ROS:   ?Please see the history of present illness. ?All other systems are reviewed and negative.  ? ? ?PHYSICAL EXAM: ?VS:  BP 134/66 (BP Location: Right Arm, Patient Position: Sitting, Cuff Size: Large)   Pulse 60   Ht 5' (1.524 m)   Wt 172 lb 6.4 oz (78.2 kg)   SpO2 97%   BMI 33.67 kg/m?  , BMI Body mass index is 33.67 kg/m?. ?GENERAL:  Well appearing ?HEENT: Pupils equal round and reactive, fundi not visualized, oral mucosa unremarkable ?NECK:  No jugular venous distention, waveform within normal limits, carotid upstroke brisk and symmetric, no bruits ?LUNGS:  Clear to auscultation bilaterally ?HEART:  RRR.  PMI not displaced or sustained,S1 and S2 within normal limits, no S3, no S4, no clicks, no rubs, no murmurs ?ABD:  Flat, positive bowel sounds normal in frequency in pitch, no bruits, no rebound, no guarding, no midline pulsatile mass, no hepatomegaly, no splenomegaly ?EXT:  2 plus pulses throughout, no edema, no cyanosis no clubbing ?SKIN:  No rashes no nodules ?NEURO:  Cranial nerves II through XII grossly intact, motor grossly intact throughout ?PSYCH:  Cognitively intact, oriented to person place and time ? ?EKG:  EKG is personally reviewed. ?02/05/2022: EKG was not ordered. ?07/02/2021: Sinus bradycardia. Rate 57 bpm. Low voltage.  ?11/15/19:  Sinus rhythm rate 61 bpm.    ?11/10/18: Sinus bradycardia.  Rate 58 bmp.  ?01/28/18: Sinus bradycardia.  Rate 58 bpm.   ?07/21/17: Sinus rhythm.  Rate 60 bpm.   ?08/25/16: sinus rhythm. Rate 60 bpm. Nonspecific T wave abnormalities. ? ?[place] 01/11/2020: ?Prox Cx lesion is 50% stenosed. Not significant by DFR, 1.0. ?Mid LAD lesion is 40% stenosed, not significant by FFR with IV adenosine, 0.83. ?The left ventricular systolic function is normal. ?LV end diastolic pressure is normal. ?The left ventricular ejection fraction is 55-65% by visual estimate. ?There is no aortic valve stenosis. ?  ?Continue aggressive risk factor modification.  Plan discharge later today. ? ?Coronary CTA 01/02/2020: ?IMPRESSION: ?1. Coronary calcium score of 741. This was 90th percentile for age ?and sex matched control. ?  ?2. Normal coronary origin with co-dominance. ?  ?3. Heavily calcified vessels with concern for obstructive disease in ?the proximal LCX, OM1 (>70%) and moderate disease (50-69% in the ?proximal LAD and D2. ?  ?4.  Will send study for FFRct. ?  ?  5.  Mild calcification of the aortic root. ? ?Recent Labs: ?07/07/2021: ALT 13  ? ?Lipid Panel ?   ?Component Value Date/Time  ? CHOL 215 (H) 07/07/2021 1417  ? TRIG 268 (H) 07/07/2021 1417  ? HDL 53 07/07/2021 1417  ? CHOLHDL 4.1 07/07/2021 1417  ? CHOLHDL 5.3 (H) 12/28/2016 3825  ? VLDL 49 (H) 12/28/2016 0924  ? LDLCALC 116 (H) 07/07/2021 1417  ? ?06/03/16:  ?WBC 5.2, hemoglobin of 0.4, hematocrit 37.4, platelets 222 ?Sodium 140, potassium 4.3 BUN 20, creatinine 0.96 ?AST 24, ALT 22 ?TSH 0.37 ?Total cholesterol 341, triglycerides 334, HDL 58, LDL 217 ? ?Wt Readings from Last 3 Encounters:  ?02/05/22 172 lb 6.4 oz (78.2 kg)  ?07/02/21 168 lb 3.2 oz (76.3 kg)  ?01/25/20 170 lb 9.6 oz (77.4 kg)  ?  ? ?ASSESSMENT AND PLAN: ? ?Coronary artery calcification seen on computed tomography ?Moderate disease in the left circumflex and LAD.  She has started increasing her exercise and has no  exertional symptoms.  We will have her start taking aspirin 81 mg daily.  Lipids are not at goal.  However she has had issues with getting her Repatha prescription.  She now has what she needs.  Will not make any medic

## 2022-02-05 NOTE — Patient Instructions (Signed)
Medication Instructions:  ?START ASPIRIN 81 MG DAILY  ? ?*If you need a refill on your cardiac medications before your next appointment, please call your pharmacy* ? ?Lab Work: ?FASTING LP/CMET IN 6 MONTHS PRIOR TO FOLLOW UP VISIT  ? ?If you have labs (blood work) drawn today and your tests are completely normal, you will receive your results only by: ?MyChart Message (if you have MyChart) OR ?A paper copy in the mail ?If you have any lab test that is abnormal or we need to change your treatment, we will call you to review the results. ? ?Testing/Procedures: ?NONE ? ?Follow-Up: ?At [facility], you and your health needs are our priority.  As part of our continuing mission to provide you with exceptional heart care, we have created designated Provider Care Teams.  These Care Teams include your primary Cardiologist (physician) and Advanced Practice Providers (APPs -  Physician Assistants and Nurse Practitioners) who all work together to provide you with the care you need, when you need it. ? ?We recommend signing up for the patient portal called "MyChart".  Sign up information is provided on this After Visit Summary.  MyChart is used to connect with patients for Virtual Visits (Telemedicine).  Patients are able to view lab/test results, encounter notes, upcoming appointments, etc.  Non-urgent messages can be sent to your provider as well.   ?To learn more about what you can do with MyChart, go to [email or web address].   ? ?Your next appointment:   ?6 month(s) ? ?The format for your next appointment:   ?In Person ? ?Provider:   ?Skeet Latch, MD  ? ?Other Instructions ?MONITOR BLOOD PRESSURE TWICE A DAY AT HOME. CALL OR SEND MYCHART MESSAGE WITH READINGS  ?

## 2022-02-06 DIAGNOSIS — K2289 Other specified disease of esophagus: Secondary | ICD-10-CM | POA: Diagnosis not present

## 2022-02-06 DIAGNOSIS — K2930 Chronic superficial gastritis without bleeding: Secondary | ICD-10-CM | POA: Diagnosis not present

## 2022-02-06 DIAGNOSIS — D123 Benign neoplasm of transverse colon: Secondary | ICD-10-CM | POA: Diagnosis not present

## 2022-02-10 ENCOUNTER — Ambulatory Visit (INDEPENDENT_AMBULATORY_CARE_PROVIDER_SITE_OTHER): Payer: Medicare Other | Admitting: Internal Medicine

## 2022-02-10 ENCOUNTER — Encounter (HOSPITAL_BASED_OUTPATIENT_CLINIC_OR_DEPARTMENT_OTHER): Payer: Self-pay | Admitting: Internal Medicine

## 2022-02-10 ENCOUNTER — Other Ambulatory Visit: Payer: Self-pay

## 2022-02-10 VITALS — BP 130/70 | HR 66 | Ht 60.0 in | Wt 173.8 lb

## 2022-02-10 DIAGNOSIS — M791 Myalgia, unspecified site: Secondary | ICD-10-CM | POA: Diagnosis not present

## 2022-02-10 DIAGNOSIS — T466X5A Adverse effect of antihyperlipidemic and antiarteriosclerotic drugs, initial encounter: Secondary | ICD-10-CM | POA: Diagnosis not present

## 2022-02-10 DIAGNOSIS — E7800 Pure hypercholesterolemia, unspecified: Secondary | ICD-10-CM

## 2022-02-10 DIAGNOSIS — G459 Transient cerebral ischemic attack, unspecified: Secondary | ICD-10-CM

## 2022-02-10 DIAGNOSIS — E785 Hyperlipidemia, unspecified: Secondary | ICD-10-CM

## 2022-02-10 DIAGNOSIS — I2510 Atherosclerotic heart disease of native coronary artery without angina pectoris: Secondary | ICD-10-CM

## 2022-02-10 NOTE — Patient Instructions (Signed)
Medication Instructions:  ?Your physician recommends that you continue on your current medications as directed. Please refer to the Current Medication list given to you today. ? ?*If you need a refill on your cardiac medications before your next appointment, please call your pharmacy* ? ? ?Lab Work: ?FASTING lab work to check cholesterol in about 4 months  ?-- complete about 1 week before your next appointment  ? ?If you have labs (blood work) drawn today and your tests are completely normal, you will receive your results only by: ?MyChart Message (if you have MyChart) OR ?A paper copy in the mail ?If you have any lab test that is abnormal or we need to change your treatment, we will call you to review the results. ? ?Follow-Up: ?At [facility], you and your health needs are our priority.  As part of our continuing mission to provide you with exceptional heart care, we have created designated Provider Care Teams.  These Care Teams include your primary Cardiologist (physician) and Advanced Practice Providers (APPs -  Physician Assistants and Nurse Practitioners) who all work together to provide you with the care you need, when you need it. ? ?We recommend signing up for the patient portal called "MyChart".  Sign up information is provided on this After Visit Summary.  MyChart is used to connect with patients for Virtual Visits (Telemedicine).  Patients are able to view lab/test results, encounter notes, upcoming appointments, etc.  Non-urgent messages can be sent to your provider as well.   ?To learn more about what you can do with MyChart, go to [email or web address].   ? ?Your next appointment:   ?4 month(s) ? ?The format for your next appointment:   ?In Person ? ?Provider:   ?Dr. Lyman Bishop  ? ?

## 2022-02-13 NOTE — Progress Notes (Signed)
? ? ?LIPID CLINIC CONSULT NOTE ? ?Chief Complaint:  ?Manage dyslipidemia ? ?Primary Care Physician: ?[name] Roles, PA ? ?Primary Cardiologist:  ?[name] Latch, MD ? ?HPI:  ?[name] is a 76 y.o. female who is being seen today for the evaluation of dyslipidemia at the request of [name] Latch, MD. this is a pleasant 76 year old female kindly referred for evaluation and management of dyslipidemia.  She is a patient of [name].  She was just seen about a week ago and had a LDL particle number and early March of 1221, LDL-C of 124, HDL-C of 57, triglycerides 197 and small LDL particle number of 344.  Lp(a) was also elevated at 101.3.  She has been intolerant to statins including atorvastatin, pravastatin and rosuvastatin in the past.  She had been started on Repatha.  It seemed that she was not taking the medicine consistently when her lipids were rechecked and plan was to repeat the lipids in about 6 months. ? ?PMHx:  ?Past Medical History:  ?Diagnosis Date  ? Colonoscopy causing post-procedural bleeding   ? Coronary artery calcification seen on computed tomography   ? Endometriosis   ? Essential hypertension 07/02/2021  ? Essential hypertension 07/02/2021  ? Hyperlipidemia   ? Myalgia 11/20/2016  ? Obesity 08/25/2016  ? Shortness of breath 07/21/2017  ? Thyroid disease   ? TIA (transient ischemic attack) 07/21/2017  ? ? ?Past Surgical History:  ?Procedure Laterality Date  ? CARDIAC CATHETERIZATION    ? INTRAVASCULAR PRESSURE WIRE/FFR STUDY N/A 01/11/2020  ? Procedure: INTRAVASCULAR PRESSURE WIRE/FFR STUDY;  Surgeon: [name] Booze, MD;  Location: [facility];  Service: Cardiovascular;  Laterality: N/A;  ? LEFT HEART CATH AND CORONARY ANGIOGRAPHY N/A 01/11/2020  ? Procedure: LEFT HEART CATH AND CORONARY ANGIOGRAPHY;  Surgeon: [name] Booze, MD;  Location: [facility];  Service: Cardiovascular;  Laterality: N/A;  ? ? ?FAMHx:  ?Family History  ?Problem Relation Age of Onset  ?  CAD Mother   ? Dementia Mother   ? CAD Father   ? CAD Sister   ? Kidney disease Brother   ? Dementia Brother   ? Lymphoma Sister   ? Breast cancer Neg Hx   ? ? ?SOCHx:  ? reports that she has quit smoking. Her smoking use included cigarettes. She smoked an average of 1 pack per day. She has never used smokeless tobacco. She reports current alcohol use. She reports that she does not use drugs. ? ?ALLERGIES:  ?Allergies  ?Allergen Reactions  ? Atorvastatin   ? Codeine   ? Demerol [Meperidine]   ? Penicillins Hives  ? Pravastatin   ? Rosuvastatin   ? ? ?ROS: ?Pertinent items noted in HPI and remainder of comprehensive ROS otherwise negative. ? ?HOME MEDS: ?Current Outpatient Medications on File Prior to Visit  ?Medication Sig Dispense Refill  ? amLODipine (NORVASC) 2.5 MG tablet Take 1 tablet (2.5 mg total) by mouth daily. 90 tablet 3  ? aspirin EC 81 MG tablet Take 81 mg by mouth daily. Swallow whole.    ? Evolocumab (REPATHA SURECLICK) 622 MG/ML SOAJ Inject 140 mg into the skin every 14 (fourteen) days. 6 mL 3  ? levothyroxine (SYNTHROID, LEVOTHROID) 88 MCG tablet Take 88 mcg by mouth daily before breakfast.    ? sertraline (ZOLOFT) 25 MG tablet Take 25 mg by mouth daily.    ? ?No current facility-administered medications on file prior to visit.  ? ? ?LABS/IMAGING: ?No results found for this  or any previous visit (from the past 48 hour(s)). ?No results found. ? ?LIPID PANEL: ?   ?Component Value Date/Time  ? CHOL 215 (H) 07/07/2021 1417  ? TRIG 268 (H) 07/07/2021 1417  ? HDL 53 07/07/2021 1417  ? CHOLHDL 4.1 07/07/2021 1417  ? CHOLHDL 5.3 (H) 12/28/2016 3382  ? VLDL 49 (H) 12/28/2016 0924  ? LDLCALC 116 (H) 07/07/2021 1417  ? ? ?WEIGHTS: ?Wt Readings from Last 3 Encounters:  ?02/10/22 173 lb 12.8 oz (78.8 kg)  ?02/05/22 172 lb 6.4 oz (78.2 kg)  ?07/02/21 168 lb 3.2 oz (76.3 kg)  ? ? ?VITALS: ?BP 130/70   Pulse 66   Ht 5' (1.524 m)   Wt 173 lb 12.8 oz (78.8 kg)   SpO2 97%   BMI 33.94 kg/m?  ? ?EXAM: ?General  appearance: alert and no distress ?Neck: no carotid bruit, no JVD, and thyroid not enlarged, symmetric, no tenderness/mass/nodules ?Lungs: clear to auscultation bilaterally ?Heart: regular rate and rhythm, S1, S2 normal, no murmur, click, rub or gallop ?Abdomen: soft, non-tender; bowel sounds normal; no masses,  no organomegaly ?Extremities: extremities normal, atraumatic, no cyanosis or edema ?Pulses: 2+ and symmetric ?Skin: Skin color, texture, turgor normal. No rashes or lesions ?Neurologic: Grossly normal ?Psych: Pleasant ? ?EKG: ?Deferred ? ?ASSESSMENT: ?Mixed dyslipidemia, goal LDL less than 70 ?Elevated LP(a) at 101 ?Statin intolerance-myalgias ?History of TIA ?Hypertension ?Coronary artery calcification by CT ? ?PLAN: ?1.   [name] has had a persistent lipid profile above a target LDL less than 70.  She could not tolerate statins but was placed on Repatha.  She had reported inconsistent use of that but more recently has been able to get the medicine consistently from her pharmacy.  We will plan to repeat her lipid NMR to see if she is now at goal on therapy.  If not we will consider other therapies. ? ?Thanks again for the kind referral. ? ?[name] Casino, MD, [facility], FACP  ?[place]  ?Medical Director of the Advanced Lipid Disorders &  ?Cardiovascular Risk Reduction Clinic ?Diplomate of the [employer] of Clinical Lipidology ?Attending Cardiologist  ?Direct Dial: [phone]  Fax: [phone]  ?Website:  [email or web address] ? ?[name] [name] ?02/13/2022, 5:30 PM  ?

## 2022-02-19 DIAGNOSIS — Z20822 Contact with and (suspected) exposure to covid-19: Secondary | ICD-10-CM | POA: Diagnosis not present

## 2022-03-05 DIAGNOSIS — Z20822 Contact with and (suspected) exposure to covid-19: Secondary | ICD-10-CM | POA: Diagnosis not present

## 2022-03-10 DIAGNOSIS — N1831 Chronic kidney disease, stage 3a: Secondary | ICD-10-CM | POA: Diagnosis not present

## 2022-03-10 DIAGNOSIS — I129 Hypertensive chronic kidney disease with stage 1 through stage 4 chronic kidney disease, or unspecified chronic kidney disease: Secondary | ICD-10-CM | POA: Diagnosis not present

## 2022-03-10 DIAGNOSIS — E785 Hyperlipidemia, unspecified: Secondary | ICD-10-CM | POA: Diagnosis not present

## 2022-03-11 DIAGNOSIS — E041 Nontoxic single thyroid nodule: Secondary | ICD-10-CM | POA: Diagnosis not present

## 2022-03-11 DIAGNOSIS — Z0000 Encounter for general adult medical examination without abnormal findings: Secondary | ICD-10-CM | POA: Diagnosis not present

## 2022-03-11 DIAGNOSIS — E559 Vitamin D deficiency, unspecified: Secondary | ICD-10-CM | POA: Diagnosis not present

## 2022-03-11 DIAGNOSIS — I10 Essential (primary) hypertension: Secondary | ICD-10-CM | POA: Diagnosis not present

## 2022-03-11 DIAGNOSIS — E7800 Pure hypercholesterolemia, unspecified: Secondary | ICD-10-CM | POA: Diagnosis not present

## 2022-03-11 DIAGNOSIS — Z1331 Encounter for screening for depression: Secondary | ICD-10-CM | POA: Diagnosis not present

## 2022-03-11 DIAGNOSIS — E039 Hypothyroidism, unspecified: Secondary | ICD-10-CM | POA: Diagnosis not present

## 2022-03-11 DIAGNOSIS — R1310 Dysphagia, unspecified: Secondary | ICD-10-CM | POA: Diagnosis not present

## 2022-03-11 DIAGNOSIS — F329 Major depressive disorder, single episode, unspecified: Secondary | ICD-10-CM | POA: Diagnosis not present

## 2022-03-16 ENCOUNTER — Encounter (HOSPITAL_BASED_OUTPATIENT_CLINIC_OR_DEPARTMENT_OTHER): Payer: Self-pay

## 2022-03-16 DIAGNOSIS — Z20822 Contact with and (suspected) exposure to covid-19: Secondary | ICD-10-CM | POA: Diagnosis not present

## 2022-03-23 DIAGNOSIS — G5603 Carpal tunnel syndrome, bilateral upper limbs: Secondary | ICD-10-CM | POA: Diagnosis not present

## 2022-05-01 DIAGNOSIS — E039 Hypothyroidism, unspecified: Secondary | ICD-10-CM | POA: Diagnosis not present

## 2022-05-06 DIAGNOSIS — G5601 Carpal tunnel syndrome, right upper limb: Secondary | ICD-10-CM | POA: Diagnosis not present

## 2022-06-09 DIAGNOSIS — L309 Dermatitis, unspecified: Secondary | ICD-10-CM | POA: Diagnosis not present

## 2022-06-16 DIAGNOSIS — M25641 Stiffness of right hand, not elsewhere classified: Secondary | ICD-10-CM | POA: Diagnosis not present

## 2022-06-16 DIAGNOSIS — M25631 Stiffness of right wrist, not elsewhere classified: Secondary | ICD-10-CM | POA: Diagnosis not present

## 2022-06-16 DIAGNOSIS — R202 Paresthesia of skin: Secondary | ICD-10-CM | POA: Diagnosis not present

## 2022-06-16 DIAGNOSIS — M79641 Pain in right hand: Secondary | ICD-10-CM | POA: Diagnosis not present

## 2022-06-16 DIAGNOSIS — R531 Weakness: Secondary | ICD-10-CM | POA: Diagnosis not present

## 2022-06-16 DIAGNOSIS — G5603 Carpal tunnel syndrome, bilateral upper limbs: Secondary | ICD-10-CM | POA: Diagnosis not present

## 2022-06-16 DIAGNOSIS — M25431 Effusion, right wrist: Secondary | ICD-10-CM | POA: Diagnosis not present

## 2022-06-16 DIAGNOSIS — M25531 Pain in right wrist: Secondary | ICD-10-CM | POA: Diagnosis not present

## 2022-06-17 DIAGNOSIS — M79641 Pain in right hand: Secondary | ICD-10-CM | POA: Diagnosis not present

## 2022-06-17 DIAGNOSIS — G5603 Carpal tunnel syndrome, bilateral upper limbs: Secondary | ICD-10-CM | POA: Diagnosis not present

## 2022-06-17 DIAGNOSIS — M25631 Stiffness of right wrist, not elsewhere classified: Secondary | ICD-10-CM | POA: Diagnosis not present

## 2022-06-17 DIAGNOSIS — M25431 Effusion, right wrist: Secondary | ICD-10-CM | POA: Diagnosis not present

## 2022-06-17 DIAGNOSIS — R202 Paresthesia of skin: Secondary | ICD-10-CM | POA: Diagnosis not present

## 2022-06-17 DIAGNOSIS — M25531 Pain in right wrist: Secondary | ICD-10-CM | POA: Diagnosis not present

## 2022-06-17 DIAGNOSIS — M25641 Stiffness of right hand, not elsewhere classified: Secondary | ICD-10-CM | POA: Diagnosis not present

## 2022-06-17 DIAGNOSIS — R531 Weakness: Secondary | ICD-10-CM | POA: Diagnosis not present

## 2022-06-19 DIAGNOSIS — M79641 Pain in right hand: Secondary | ICD-10-CM | POA: Diagnosis not present

## 2022-06-19 DIAGNOSIS — G5603 Carpal tunnel syndrome, bilateral upper limbs: Secondary | ICD-10-CM | POA: Diagnosis not present

## 2022-06-19 DIAGNOSIS — R202 Paresthesia of skin: Secondary | ICD-10-CM | POA: Diagnosis not present

## 2022-06-19 DIAGNOSIS — M25531 Pain in right wrist: Secondary | ICD-10-CM | POA: Diagnosis not present

## 2022-06-19 DIAGNOSIS — M25641 Stiffness of right hand, not elsewhere classified: Secondary | ICD-10-CM | POA: Diagnosis not present

## 2022-06-19 DIAGNOSIS — M25431 Effusion, right wrist: Secondary | ICD-10-CM | POA: Diagnosis not present

## 2022-06-19 DIAGNOSIS — M25631 Stiffness of right wrist, not elsewhere classified: Secondary | ICD-10-CM | POA: Diagnosis not present

## 2022-06-19 DIAGNOSIS — R531 Weakness: Secondary | ICD-10-CM | POA: Diagnosis not present

## 2022-06-22 DIAGNOSIS — M79641 Pain in right hand: Secondary | ICD-10-CM | POA: Diagnosis not present

## 2022-06-22 DIAGNOSIS — M25631 Stiffness of right wrist, not elsewhere classified: Secondary | ICD-10-CM | POA: Diagnosis not present

## 2022-06-22 DIAGNOSIS — R202 Paresthesia of skin: Secondary | ICD-10-CM | POA: Diagnosis not present

## 2022-06-22 DIAGNOSIS — R531 Weakness: Secondary | ICD-10-CM | POA: Diagnosis not present

## 2022-06-22 DIAGNOSIS — M25531 Pain in right wrist: Secondary | ICD-10-CM | POA: Diagnosis not present

## 2022-06-22 DIAGNOSIS — G5603 Carpal tunnel syndrome, bilateral upper limbs: Secondary | ICD-10-CM | POA: Diagnosis not present

## 2022-06-22 DIAGNOSIS — M25431 Effusion, right wrist: Secondary | ICD-10-CM | POA: Diagnosis not present

## 2022-06-22 DIAGNOSIS — M25641 Stiffness of right hand, not elsewhere classified: Secondary | ICD-10-CM | POA: Diagnosis not present

## 2022-07-21 DIAGNOSIS — M79641 Pain in right hand: Secondary | ICD-10-CM | POA: Diagnosis not present

## 2022-07-21 DIAGNOSIS — R202 Paresthesia of skin: Secondary | ICD-10-CM | POA: Diagnosis not present

## 2022-07-21 DIAGNOSIS — R531 Weakness: Secondary | ICD-10-CM | POA: Diagnosis not present

## 2022-07-21 DIAGNOSIS — M25531 Pain in right wrist: Secondary | ICD-10-CM | POA: Diagnosis not present

## 2022-07-21 DIAGNOSIS — M25431 Effusion, right wrist: Secondary | ICD-10-CM | POA: Diagnosis not present

## 2022-07-21 DIAGNOSIS — G5603 Carpal tunnel syndrome, bilateral upper limbs: Secondary | ICD-10-CM | POA: Diagnosis not present

## 2022-07-21 DIAGNOSIS — M25641 Stiffness of right hand, not elsewhere classified: Secondary | ICD-10-CM | POA: Diagnosis not present

## 2022-07-21 DIAGNOSIS — M25631 Stiffness of right wrist, not elsewhere classified: Secondary | ICD-10-CM | POA: Diagnosis not present

## 2022-07-23 DIAGNOSIS — M25531 Pain in right wrist: Secondary | ICD-10-CM | POA: Diagnosis not present

## 2022-07-23 DIAGNOSIS — G5603 Carpal tunnel syndrome, bilateral upper limbs: Secondary | ICD-10-CM | POA: Diagnosis not present

## 2022-07-23 DIAGNOSIS — M25641 Stiffness of right hand, not elsewhere classified: Secondary | ICD-10-CM | POA: Diagnosis not present

## 2022-07-23 DIAGNOSIS — M79641 Pain in right hand: Secondary | ICD-10-CM | POA: Diagnosis not present

## 2022-07-23 DIAGNOSIS — R202 Paresthesia of skin: Secondary | ICD-10-CM | POA: Diagnosis not present

## 2022-07-23 DIAGNOSIS — R531 Weakness: Secondary | ICD-10-CM | POA: Diagnosis not present

## 2022-07-23 DIAGNOSIS — M25631 Stiffness of right wrist, not elsewhere classified: Secondary | ICD-10-CM | POA: Diagnosis not present

## 2022-07-23 DIAGNOSIS — M25431 Effusion, right wrist: Secondary | ICD-10-CM | POA: Diagnosis not present

## 2022-07-28 ENCOUNTER — Telehealth: Payer: Self-pay | Admitting: Cardiovascular Disease

## 2022-07-28 ENCOUNTER — Telehealth: Payer: Self-pay | Admitting: Internal Medicine

## 2022-07-28 ENCOUNTER — Ambulatory Visit (INDEPENDENT_AMBULATORY_CARE_PROVIDER_SITE_OTHER): Payer: Medicare Other | Admitting: Internal Medicine

## 2022-07-28 ENCOUNTER — Encounter (HOSPITAL_BASED_OUTPATIENT_CLINIC_OR_DEPARTMENT_OTHER): Payer: Self-pay | Admitting: Internal Medicine

## 2022-07-28 VITALS — BP 129/78 | HR 59 | Ht 60.0 in | Wt 169.4 lb

## 2022-07-28 DIAGNOSIS — G459 Transient cerebral ischemic attack, unspecified: Secondary | ICD-10-CM

## 2022-07-28 DIAGNOSIS — T466X5D Adverse effect of antihyperlipidemic and antiarteriosclerotic drugs, subsequent encounter: Secondary | ICD-10-CM | POA: Diagnosis not present

## 2022-07-28 DIAGNOSIS — E7841 Elevated Lipoprotein(a): Secondary | ICD-10-CM | POA: Diagnosis not present

## 2022-07-28 DIAGNOSIS — E785 Hyperlipidemia, unspecified: Secondary | ICD-10-CM

## 2022-07-28 DIAGNOSIS — M791 Myalgia, unspecified site: Secondary | ICD-10-CM | POA: Diagnosis not present

## 2022-07-28 DIAGNOSIS — I2510 Atherosclerotic heart disease of native coronary artery without angina pectoris: Secondary | ICD-10-CM

## 2022-07-28 DIAGNOSIS — T466X5A Adverse effect of antihyperlipidemic and antiarteriosclerotic drugs, initial encounter: Secondary | ICD-10-CM

## 2022-07-28 NOTE — Patient Instructions (Signed)
Medication Instructions:  Your Leqvio injection is scheduled on _______ at _______. Please arrive at [facility] and enter the hospital through [facility] [employer] that's located at [employer] 15 minutes before your scheduled injection time. Walk in to the registration desk and let them know that you are scheduled for an injection in the infusion center. They will register you and take you to your appointment.   If patient assistance is needed, you may be scheduled for injections at a [facility] infusion clinic off [address]   [name] is a subcutaneous injection that will lower your LDL cholesterol by 50%. If this is your first injection, your next injection will be due in 3 months. If this is NOT your first injection, your next injection will be due in 6 months. If you have not heard from [facility] to schedule your next appointment within 2 weeks of your next anticipated injection, please call [facility] to schedule this at:                [phone] - if you are seen at the [facility] office               [phone] - if you are seen at the [facility] office  *If you need a refill on your cardiac medications before your next appointment, please call your pharmacy*   Lab Work: FASTING lab work to check cholesterol about 1 week before next visit   If you have labs (blood work) drawn today and your tests are completely normal, you will receive your results only by: [place] (if you have MyChart) OR A paper copy in the mail If you have any lab test that is abnormal or we need to change your treatment, we will call you to review the results.   Follow-Up: At [facility], you and your health needs are our priority.  As part of our continuing mission to provide you with exceptional heart care, we have created designated Provider Care Teams.  These Care Teams include your primary Cardiologist (physician) and Advanced  Practice Providers (APPs -  Physician Assistants and Nurse Practitioners) who all work together to provide you with the care you need, when you need it.  We recommend signing up for the patient portal called "MyChart".  Sign up information is provided on this After Visit Summary.  MyChart is used to connect with patients for Virtual Visits (Telemedicine).  Patients are able to view lab/test results, encounter notes, upcoming appointments, etc.  Non-urgent messages can be sent to your provider as well.   To learn more about what you can do with MyChart, go to [email or web address].    Your next appointment:    5-6 months with Dr. Debara Pickett

## 2022-07-28 NOTE — Telephone Encounter (Signed)
Patient seen by Dr. Debara Pickett for lipid appt on 07/28/22 She reports she is NOT on aspirin post-op (hand surgery) as she was not advised when to resume. She wanted Dr. Oval Linsey to advise on this. Advised patient will send message

## 2022-07-28 NOTE — Telephone Encounter (Signed)
Dr. Debara Pickett has recommended [facility] for patient. She has Medicare A&B and Tricare Will fax benefits investigation form to [facility] and follow up with patient on findings.

## 2022-07-28 NOTE — Progress Notes (Signed)
LIPID CLINIC CONSULT NOTE  Chief Complaint:  Manage dyslipidemia  Primary Care Physician: Johna Roles, PA  Primary Cardiologist:  Skeet Latch, MD  HPI:  [name] is a 76 y.o. female who is being seen today for the evaluation of dyslipidemia at the request of [place], Olivia M, [place]. this is a pleasant 76 year old female kindly referred for evaluation and management of dyslipidemia.  She is a patient of Dr. Oval Linsey.  She was just seen about a week ago and had a LDL particle number and early March of 1221, LDL-C of 124, HDL-C of 57, triglycerides 197 and small LDL particle number of 344.  Lp(a) was also elevated at 101.3.  She has been intolerant to statins including atorvastatin, pravastatin and rosuvastatin in the past.  She had been started on Repatha.  It seemed that she was not taking the medicine consistently when her lipids were rechecked and plan was to repeat the lipids in about 6 months.  PMHx:  Past Medical History:  Diagnosis Date   Colonoscopy causing post-procedural bleeding    Coronary artery calcification seen on computed tomography    Endometriosis    Essential hypertension 07/02/2021   Essential hypertension 07/02/2021   Hyperlipidemia    Myalgia 11/20/2016   Obesity 08/25/2016   Shortness of breath 07/21/2017   Thyroid disease    TIA (transient ischemic attack) 07/21/2017    Past Surgical History:  Procedure Laterality Date   CARDIAC CATHETERIZATION     INTRAVASCULAR PRESSURE WIRE/FFR STUDY N/A 01/11/2020   Procedure: INTRAVASCULAR PRESSURE WIRE/FFR STUDY;  Surgeon: Jettie Booze, MD;  Location: [facility];  Service: Cardiovascular;  Laterality: N/A;   LEFT HEART CATH AND CORONARY ANGIOGRAPHY N/A 01/11/2020   Procedure: LEFT HEART CATH AND CORONARY ANGIOGRAPHY;  Surgeon: Jettie Booze, MD;  Location: [facility];  Service: Cardiovascular;  Laterality: N/A;    FAMHx:  Family History  Problem Relation Age of Onset    CAD Mother    Dementia Mother    CAD Father    CAD Sister    Kidney disease Brother    Dementia Brother    Lymphoma Sister    Breast cancer Neg Hx     SOCHx:   reports that she has quit smoking. Her smoking use included cigarettes. She smoked an average of 1 pack per day. She has never used smokeless tobacco. She reports current alcohol use. She reports that she does not use drugs.  ALLERGIES:  Allergies  Allergen Reactions   Atorvastatin    Codeine    Demerol [Meperidine]    Penicillins Hives   Pravastatin    Rosuvastatin     ROS: Pertinent items noted in HPI and remainder of comprehensive ROS otherwise negative.  HOME MEDS: Current Outpatient Medications on File Prior to Visit  Medication Sig Dispense Refill   amLODipine (NORVASC) 2.5 MG tablet Take 1 tablet (2.5 mg total) by mouth daily. 90 tablet 3   aspirin EC 81 MG tablet Take 81 mg by mouth daily. Swallow whole.     levothyroxine (SYNTHROID) 75 MCG tablet Take 75 mcg by mouth every morning.     sertraline (ZOLOFT) 25 MG tablet Take 25 mg by mouth daily.     Evolocumab (REPATHA SURECLICK) 810 MG/ML SOAJ Inject 140 mg into the skin every 14 (fourteen) days. (Patient not taking: Reported on 07/28/2022) 6 mL 3   No current facility-administered medications on file prior to visit.    LABS/IMAGING:  No results found for this or any previous visit (from the past 48 hour(s)). No results found.  LIPID PANEL:    Component Value Date/Time   CHOL 215 (H) 07/07/2021 1417   TRIG 268 (H) 07/07/2021 1417   HDL 53 07/07/2021 1417   CHOLHDL 4.1 07/07/2021 1417   CHOLHDL 5.3 (H) 12/28/2016 0924   VLDL 49 (H) 12/28/2016 0924   LDLCALC 116 (H) 07/07/2021 1417    WEIGHTS: Wt Readings from Last 3 Encounters:  07/28/22 169 lb 6.4 oz (76.8 kg)  02/10/22 173 lb 12.8 oz (78.8 kg)  02/05/22 172 lb 6.4 oz (78.2 kg)    VITALS: BP 129/78 (BP Location: Left Arm, Patient Position: Sitting, Cuff Size: Large)   Pulse (!) 59   Ht 5'  (1.524 m)   Wt 169 lb 6.4 oz (76.8 kg)   SpO2 94%   BMI 33.08 kg/m   EXAM: General appearance: alert and no distress Neck: no carotid bruit, no JVD, and thyroid not enlarged, symmetric, no tenderness/mass/nodules Lungs: clear to auscultation bilaterally Heart: regular rate and rhythm, S1, S2 normal, no murmur, click, rub or gallop Abdomen: soft, non-tender; bowel sounds normal; no masses,  no organomegaly Extremities: extremities normal, atraumatic, no cyanosis or edema Pulses: 2+ and symmetric Skin: Skin color, texture, turgor normal. No rashes or lesions Neurologic: Grossly normal Psych: Pleasant  EKG: Deferred  ASSESSMENT: Mixed dyslipidemia, goal LDL less than 70 Elevated LP(a) at 101 Statin intolerance-myalgias History of TIA Hypertension Coronary artery calcification by CT  PLAN: 1.   [name] has had a persistent lipid profile above a target LDL less than 70.  She could not tolerate statins but was placed on Repatha.  She had reported inconsistent use of that but more recently has been able to get the medicine consistently from her pharmacy.  We will plan to repeat her lipid NMR to see if she is now at goal on therapy.  If not we will consider other therapies.  Thanks again for the kind referral.  Pixie Casino, MD, FACC, [place] Director of the Advanced Lipid Disorders &  Cardiovascular Risk Reduction Clinic Diplomate of the American Board of Clinical Lipidology Attending Cardiologist  Direct Dial: [phone]  Fax: [phone]  Website:  [email or web address] Polansky 07/28/2022, 3:14 PM

## 2022-08-03 DIAGNOSIS — E785 Hyperlipidemia, unspecified: Secondary | ICD-10-CM | POA: Diagnosis not present

## 2022-08-03 DIAGNOSIS — I10 Essential (primary) hypertension: Secondary | ICD-10-CM | POA: Diagnosis not present

## 2022-08-03 DIAGNOSIS — E7800 Pure hypercholesterolemia, unspecified: Secondary | ICD-10-CM | POA: Diagnosis not present

## 2022-08-03 LAB — COMPREHENSIVE METABOLIC PANEL
ALT: 16 IU/L (ref 0–32)
AST: 19 IU/L (ref 0–40)
Albumin/Globulin Ratio: 1.5 (ref 1.2–2.2)
Albumin: 4.1 g/dL (ref 3.8–4.8)
Alkaline Phosphatase: 102 IU/L (ref 44–121)
BUN/Creatinine Ratio: 16 (ref 12–28)
BUN: 17 mg/dL (ref 8–27)
Bilirubin Total: 0.5 mg/dL (ref 0.0–1.2)
CO2: 22 mmol/L (ref 20–29)
Calcium: 9.7 mg/dL (ref 8.7–10.3)
Chloride: 106 mmol/L (ref 96–106)
Creatinine, Ser: 1.05 mg/dL — ABNORMAL HIGH (ref 0.57–1.00)
Globulin, Total: 2.7 g/dL (ref 1.5–4.5)
Glucose: 95 mg/dL (ref 70–99)
Potassium: 4.3 mmol/L (ref 3.5–5.2)
Sodium: 142 mmol/L (ref 134–144)
Total Protein: 6.8 g/dL (ref 6.0–8.5)
eGFR: 54 mL/min/{1.73_m2} — ABNORMAL LOW (ref >59)

## 2022-08-03 LAB — LIPID PANEL
Chol/HDL Ratio: 3.9 ratio (ref 0.0–4.4)
Cholesterol, Total: 267 mg/dL — ABNORMAL HIGH (ref 100–199)
HDL: 69 mg/dL (ref >39)
LDL Chol Calc (NIH): 162 mg/dL — ABNORMAL HIGH (ref 0–99)
Triglycerides: 200 mg/dL — ABNORMAL HIGH (ref 0–149)
VLDL Cholesterol Cal: 36 mg/dL (ref 5–40)

## 2022-08-04 LAB — NMR, LIPOPROFILE
Cholesterol, Total: 272 mg/dL — ABNORMAL HIGH (ref 100–199)
HDL Particle Number: 33.8 umol/L (ref >=30.5)
HDL-C: 69 mg/dL (ref >39)
LDL Particle Number: 1855 nmol/L — ABNORMAL HIGH (ref <1000)
LDL Size: 20.8 nm (ref >20.5)
LDL-C (NIH Calc): 168 mg/dL — ABNORMAL HIGH (ref 0–99)
LP-IR Score: 35 (ref <=45)
Small LDL Particle Number: 585 nmol/L — ABNORMAL HIGH (ref <=527)
Triglycerides: 192 mg/dL — ABNORMAL HIGH (ref 0–149)

## 2022-08-04 LAB — LIPOPROTEIN A (LPA): Lipoprotein (a): 134 nmol/L — ABNORMAL HIGH (ref <75.0)

## 2022-08-04 NOTE — Telephone Encounter (Signed)
Okay to resume ASA '81mg'$  daily so long as incision healing well.   Loel Dubonnet, NP

## 2022-08-05 NOTE — Telephone Encounter (Signed)
Called patient, no answer, left recommendations on voicemail okay per DPR      "Okay to resume ASA '81mg'$  daily so long as incision healing well.    Loel Dubonnet, NP "

## 2022-08-05 NOTE — Telephone Encounter (Signed)
Including this from result note,    "Stable kidney function. Normal electrolytes, liver. Total cholesterol, triglycerides, LDL (bad cholesterol) elevated. CCing Dr. Debara Pickett as FYI only. He has recommended Leqvio and in process of approval being obtained. Continue with current plan. :"    Results called to patient who verbalizes understanding!

## 2022-08-06 ENCOUNTER — Telehealth: Payer: Self-pay | Admitting: Pharmacy Technician

## 2022-08-06 ENCOUNTER — Encounter: Payer: Self-pay | Admitting: Internal Medicine

## 2022-08-06 NOTE — Telephone Encounter (Addendum)
Auth Submission: NO AUTH NEEDED Payer: MEDICARE A&B / TRICARE Medication & CPT/J Code(s) submitted: Leqvio (Inclisiran) [id number] Route of submission (phone, fax, portal):  Phone # Fax # Auth type: Buy/Bill Units/visits requested: X3 DOSES Reference number:  TRICARE: Bree-T 08/06/22 @ 2:14 Medicare: [id number] Approval from: 08/06/22 to 11/15/22  Medicare will cover 102 and Tricare will pick-up remaining 20%. Pt has met $226 medicare deductible   Medicare and tricare coverage reviewed and auth extended to 08/07/23

## 2022-08-06 NOTE — Addendum Note (Signed)
Addended by: Fidel Levy on: 08/06/2022 09:55 AM   Modules accepted: Orders

## 2022-08-06 NOTE — Telephone Encounter (Signed)
Spoke with patient about Leqvio statement of benefits. Advised will refer to [employer] for follow up on this. She would prefer going to [facility] for injections, pending insurance benefits/coverage.    Patient has been identified as candidate for Leqvio  Benefits investigation enrollment completed on 07/29/22  Benefits investigation report notes the following:  Type of insurance: medicare/tricare  OOP Max: n/a / Met: n/a  Deductible: $226 - met $226  Co-insurance: 20%  PA required: unsure  PA phone number: n/a  Benefits Summary Details:  Primary: medicare part B covers leqvio at 80% after $226 calendar year deductible is met. Once met, patient responsible for 20% coinsurance. No PA required  [place]ard: tricare for life - leqvio service center unable to obtain coverage and benefits for leqvio J code [id number] d/t no longer quoting benefits over the phone. The provider must use portal to obtain coverage and benefits for [facility] - phone # [phone] provided

## 2022-08-10 ENCOUNTER — Other Ambulatory Visit: Payer: Self-pay

## 2022-08-11 NOTE — Telephone Encounter (Signed)
Patient scheduled for first Leqvio injection on 08/14/22

## 2022-08-12 ENCOUNTER — Encounter (HOSPITAL_BASED_OUTPATIENT_CLINIC_OR_DEPARTMENT_OTHER): Payer: Self-pay | Admitting: Cardiovascular Disease

## 2022-08-12 ENCOUNTER — Ambulatory Visit (INDEPENDENT_AMBULATORY_CARE_PROVIDER_SITE_OTHER): Payer: Medicare Other | Admitting: Cardiovascular Disease

## 2022-08-12 DIAGNOSIS — E782 Mixed hyperlipidemia: Secondary | ICD-10-CM

## 2022-08-12 DIAGNOSIS — R011 Cardiac murmur, unspecified: Secondary | ICD-10-CM | POA: Diagnosis not present

## 2022-08-12 DIAGNOSIS — I2510 Atherosclerotic heart disease of native coronary artery without angina pectoris: Secondary | ICD-10-CM | POA: Diagnosis not present

## 2022-08-12 DIAGNOSIS — I10 Essential (primary) hypertension: Secondary | ICD-10-CM | POA: Diagnosis not present

## 2022-08-12 HISTORY — DX: Cardiac murmur, unspecified: R01.1

## 2022-08-12 NOTE — Assessment & Plan Note (Signed)
Starting Inclisiran as above.  LDL goal <70.

## 2022-08-12 NOTE — Assessment & Plan Note (Addendum)
New murmur noted on exam.  We will get an echo to assess.  Lp(a) is elevated, which raises the risk of aortic stenosis.

## 2022-08-12 NOTE — Assessment & Plan Note (Signed)
Blood pressures well controlled on amlodipine.  She is no longer having dizziness or swelling since the dose was reduced.

## 2022-08-12 NOTE — Progress Notes (Deleted)
Cardiology Office Note  Date:  08/12/2022   ID:  Clifton, Kovacic 06/22/1946, MRN [id number]  PCP:  Johna Roles, PA  Cardiologist:   Skeet Latch, MD   No chief complaint on file.  History of Present Illness: [name] is a 76 y.o. female with familial hyperlipidemia, nonobstructive CAD, hypertension, TIA, Graves disease, and depression who presents for follow up.  Ms. Pfahler reports several years of hyperlipidemia. She has previously tried atorvastatin and rosuvastatin but developed chest pain and shortness of breath with these medications. During that time she underwent cardiac catheterization in 2011 that reportedly showed mild CAD. She was also noted to have a murmur in 2014 and had an echo that was reportedly normal.  She was started on pravastatin '5mg'$  three times per week.  She followed up with our pharmacist and was tolerating it well but her lipids were not controlled so it was increased and she developed myalgias. She was then started on Repatha and has been tolerating it well.  She was seen in the ED 01/11/2020 for an episode of angina pectoris. She reported exertional fatigue. She had a Coronary CT 12/2019 that had significant disease in the left circ OM1 and LAD. FFR was abnormal in the mid LAD. She had a cardiac cath at which time she was found to have moderate disease in left circumflex and moderate LAD with no obstructive disease. She followed up with Coletta Memos, NP, on 01/25/2020 and was doing well.  At the last visit she was doing well.  She was exercising regularly.  She continues to struggle with side effects due to Repatha and was not taking it consistently.   She was referred to Dr. Debara Pickett and was thought to be a good candidate for Leqvio.   Past Medical History:  Diagnosis Date   Colonoscopy causing post-procedural bleeding    Coronary artery calcification seen on computed tomography    Endometriosis    Essential hypertension 07/02/2021   Essential  hypertension 07/02/2021   Hyperlipidemia    Myalgia 11/20/2016   Obesity 08/25/2016   Shortness of breath 07/21/2017   Thyroid disease    TIA (transient ischemic attack) 07/21/2017    Past Surgical History:  Procedure Laterality Date   CARDIAC CATHETERIZATION     INTRAVASCULAR PRESSURE WIRE/FFR STUDY N/A 01/11/2020   Procedure: INTRAVASCULAR PRESSURE WIRE/FFR STUDY;  Surgeon: Jettie Booze, MD;  Location: [facility];  Service: Cardiovascular;  Laterality: N/A;   LEFT HEART CATH AND CORONARY ANGIOGRAPHY N/A 01/11/2020   Procedure: LEFT HEART CATH AND CORONARY ANGIOGRAPHY;  Surgeon: Jettie Booze, MD;  Location: [facility];  Service: Cardiovascular;  Laterality: N/A;     Current Outpatient Medications  Medication Sig Dispense Refill   amLODipine (NORVASC) 2.5 MG tablet Take 1 tablet (2.5 mg total) by mouth daily. 90 tablet 3   aspirin EC 81 MG tablet Take 81 mg by mouth daily. Swallow whole.     levothyroxine (SYNTHROID) 75 MCG tablet Take 75 mcg by mouth every morning.     sertraline (ZOLOFT) 25 MG tablet Take 25 mg by mouth daily.     No current facility-administered medications for this visit.    Allergies:   Atorvastatin, Codeine, Demerol [meperidine], Penicillins, Pravastatin, Repatha [evolocumab], and Rosuvastatin    Social History:  The patient  reports that she has quit smoking. Her smoking use included cigarettes. She smoked an average of 1 pack per day. She has never used smokeless tobacco.  She reports current alcohol use. She reports that she does not use drugs.   Family History:  The patient's  family history includes CAD in her father, mother, and sister; Dementia in her brother and mother; Kidney disease in her brother; Lymphoma in her sister.    ROS:   Please see the history of present illness. All other systems are reviewed and negative.    PHYSICAL EXAM: VS:  There were no vitals taken for this visit. , BMI There is no height or weight  on file to calculate BMI. GENERAL:  Well appearing HEENT: Pupils equal round and reactive, fundi not visualized, oral mucosa unremarkable NECK:  No jugular venous distention, waveform within normal limits, carotid upstroke brisk and symmetric, no bruits LUNGS:  Clear to auscultation bilaterally HEART:  RRR.  PMI not displaced or sustained,S1 and S2 within normal limits, no S3, no S4, no clicks, no rubs, no murmurs ABD:  Flat, positive bowel sounds normal in frequency in pitch, no bruits, no rebound, no guarding, no midline pulsatile mass, no hepatomegaly, no splenomegaly EXT:  2 plus pulses throughout, no edema, no cyanosis no clubbing SKIN:  No rashes no nodules NEURO:  Cranial nerves II through XII grossly intact, motor grossly intact throughout PSYCH:  Cognitively intact, oriented to person place and time  EKG:  EKG is personally reviewed. 02/05/2022: EKG was not ordered. 07/02/2021: Sinus bradycardia. Rate 57 bpm. Low voltage.  11/15/19: Sinus rhythm rate 61 bpm.    11/10/18: Sinus bradycardia.  Rate 58 bmp.  01/28/18: Sinus bradycardia.  Rate 58 bpm.   07/21/17: Sinus rhythm.  Rate 60 bpm.   08/25/16: sinus rhythm. Rate 60 bpm. Nonspecific T wave abnormalities.  LHC 01/11/2020: Prox Cx lesion is 50% stenosed. Not significant by DFR, 1.0. Mid LAD lesion is 40% stenosed, not significant by FFR with IV adenosine, 0.83. The left ventricular systolic function is normal. LV end diastolic pressure is normal. The left ventricular ejection fraction is 55-65% by visual estimate. There is no aortic valve stenosis.   Continue aggressive risk factor modification.  Plan discharge later today.  Coronary CTA 01/02/2020: IMPRESSION: 1. Coronary calcium score of 741. This was 90th percentile for age and sex matched control.   2. Normal coronary origin with co-dominance.   3. Heavily calcified vessels with concern for obstructive disease in the proximal LCX, OM1 (>70%) and moderate disease (50-69%  in the proximal LAD and D2.   4.  Will send study for FFRct.   5.  Mild calcification of the aortic root.  Recent Labs: 08/03/2022: ALT 16; BUN 17; Creatinine, Ser 1.05; Potassium 4.3; Sodium 142   Lipid Panel    Component Value Date/Time   CHOL 267 (H) 08/03/2022 0948   TRIG 200 (H) 08/03/2022 0948   HDL 69 08/03/2022 0948   CHOLHDL 3.9 08/03/2022 0948   CHOLHDL 5.3 (H) 12/28/2016 0924   VLDL 49 (H) 12/28/2016 0924   LDLCALC 162 (H) 08/03/2022 0948   06/03/16:  WBC 5.2, hemoglobin of 0.4, hematocrit 37.4, platelets 222 Sodium 140, potassium 4.3 BUN 20, creatinine 0.96 AST 24, ALT 22 TSH 0.37 Total cholesterol 341, triglycerides 334, HDL 58, LDL 217  Wt Readings from Last 3 Encounters:  07/28/22 169 lb 6.4 oz (76.8 kg)  02/10/22 173 lb 12.8 oz (78.8 kg)  02/05/22 172 lb 6.4 oz (78.2 kg)     ASSESSMENT AND PLAN:  No problem-specific Assessment & Plan notes found for this encounter.     Current medicines are reviewed  at length with the patient today.  The patient does not have concerns regarding medicines.  The following changes have been made: none  Labs/ tests ordered today include:   No orders of the defined types were placed in this encounter.   Disposition:   FU with Husain Costabile C. Oval Linsey, MD, [facility] in 6 months.  Signed, Laurana Magistro C. Oval Linsey, MD, [facility]  08/12/2022 7:49 AM    [facility]

## 2022-08-12 NOTE — Progress Notes (Signed)
Cardiology Office Note  Date:  08/12/2022   ID:  [name], Short 04-Apr-1946, MRN [id number]  PCP:  Johna Roles, PA  Cardiologist:   Skeet Latch, MD   No chief complaint on file.  History of Present Illness: [name] is a 76 y.o. female with familial hyperlipidemia, nonobstructive CAD, hypertension, TIA, Graves disease, and depression who presents for follow up.  [name] reports several years of hyperlipidemia. She has previously tried atorvastatin and rosuvastatin but developed chest pain and shortness of breath with these medications. During that time she underwent cardiac catheterization in 2011 that reportedly showed mild CAD. She was also noted to have a murmur in 2014 and had an echo that was reportedly normal.  She was started on pravastatin '5mg'$  three times per week.  She followed up with our pharmacist and was tolerating it well but her lipids were not controlled so it was increased and she developed myalgias. She was then started on Repatha and has been tolerating it well.  She was seen in the ED 01/11/2020 for an episode of angina pectoris. She reported exertional fatigue. She had a Coronary CT 12/2019 that had significant disease in the left circ OM1 and LAD. FFR was abnormal in the mid LAD. She had a cardiac cath at which time she was found to have moderate disease in left circumflex and moderate LAD with no obstructive disease. She followed up with [name] Memos, NP, on 01/25/2020 and was doing well.  At the last visit she was doing well.  She was exercising regularly.  She continues to struggle with side effects due to Repatha and was not taking it consistently.   She was referred to Dr. Debara Pickett and was thought to be a good candidate for Leqvio. Today, she complains of longstanding issues with fatigue. Even if she goes to bed early, she usually wakes up feeling tired. Occasionally she has some swelling, but this is mild. Additionally she reports developing a rash  recently, which covered areas across her chest inferior to her breasts, wrapping around to her back. This lasted for a time before resolving spontaneously. She mentions that she had changed laundry detergents recently. Periodically she checks blood pressures at home. Her readings are mostly similar to today's in clinic reading 130/64. She confirms that she was taken off [place]. Soon she will take her first injection of Leqvio. She has successfully lost some weight, at home she is 163-165 lbs. For exercise she walks and plays with her new puppy. She was using a stationary bike but this was moved to the garage to avoid damage from the puppy. Generally she denies any issues or symptoms while walking. Since her last appointment she had surgery on her right hand; she continues to recover slowly but well overall. She denies any palpitations, chest pain, or shortness of breath. No lightheadedness, headaches, syncope, orthopnea, or PND.   Past Medical History:  Diagnosis Date   Colonoscopy causing post-procedural bleeding    Coronary artery calcification seen on computed tomography    Endometriosis    Essential hypertension 07/02/2021   Essential hypertension 07/02/2021   Hyperlipidemia    Murmur 08/12/2022   Myalgia 11/20/2016   Obesity 08/25/2016   Shortness of breath 07/21/2017   Thyroid disease    TIA (transient ischemic attack) 07/21/2017    Past Surgical History:  Procedure Laterality Date   CARDIAC CATHETERIZATION     INTRAVASCULAR PRESSURE WIRE/FFR STUDY N/A 01/11/2020   Procedure: INTRAVASCULAR PRESSURE WIRE/FFR STUDY;  Surgeon: Jettie Booze, MD;  Location: [facility];  Service: Cardiovascular;  Laterality: N/A;   LEFT HEART CATH AND CORONARY ANGIOGRAPHY N/A 01/11/2020   Procedure: LEFT HEART CATH AND CORONARY ANGIOGRAPHY;  Surgeon: Jettie Booze, MD;  Location: [facility];  Service: Cardiovascular;  Laterality: N/A;     Current Outpatient Medications  Medication  Sig Dispense Refill   amLODipine (NORVASC) 2.5 MG tablet Take 1 tablet (2.5 mg total) by mouth daily. 90 tablet 3   aspirin EC 81 MG tablet Take 81 mg by mouth daily. Swallow whole.     levothyroxine (SYNTHROID) 75 MCG tablet Take 75 mcg by mouth every morning.     sertraline (ZOLOFT) 25 MG tablet Take 25 mg by mouth daily.     No current facility-administered medications for this visit.    Allergies:   Atorvastatin, Codeine, Demerol [meperidine], Penicillins, Pravastatin, Repatha [evolocumab], and Rosuvastatin    Social History:  The patient  reports that she has quit smoking. Her smoking use included cigarettes. She smoked an average of 1 pack per day. She has never used smokeless tobacco. She reports current alcohol use. She reports that she does not use drugs.   Family History:  The patient's family history includes CAD in her father, mother, and sister; Dementia in her brother and mother; Kidney disease in her brother; Lymphoma in her sister.    ROS:   Please see the history of present illness. (+) Fatigue (+) Daytime somnolence (+) Mild LE edema All other systems are reviewed and negative.    PHYSICAL EXAM: VS:  BP 130/64 (BP Location: Left Arm, Patient Position: Sitting, Cuff Size: Large)   Pulse (!) 58   Ht 5' (1.524 m)   Wt 170 lb (77.1 kg)   BMI 33.20 kg/m  , BMI Body mass index is 33.2 kg/m. GENERAL:  Well appearing HEENT: Pupils equal round and reactive, fundi not visualized, oral mucosa unremarkable NECK:  No jugular venous distention, waveform within normal limits, carotid upstroke brisk and symmetric, no bruits LUNGS:  Clear to auscultation bilaterally HEART:  RRR.  PMI not displaced or sustained,S1 and S2 within normal limits, no S3, no S4, no clicks, no rubs, 2/6 systolic murmur. ABD:  Flat, positive bowel sounds normal in frequency in pitch, no bruits, no rebound, no guarding, no midline pulsatile mass, no hepatomegaly, no splenomegaly EXT:  2 plus pulses  throughout, no edema, no cyanosis no clubbing SKIN:  No rashes no nodules NEURO:  Cranial nerves II through XII grossly intact, motor grossly intact throughout PSYCH:  Cognitively intact, oriented to person place and time  EKG:  EKG is personally reviewed. 08/12/2022:  Sinus bradycardia. Rate 58 bpm.  02/05/2022: EKG was not ordered. 07/02/2021: Sinus bradycardia. Rate 57 bpm. Low voltage.  11/15/19: Sinus rhythm rate 61 bpm.    11/10/18: Sinus bradycardia.  Rate 58 bmp.  01/28/18: Sinus bradycardia.  Rate 58 bpm.   07/21/17: Sinus rhythm.  Rate 60 bpm.   08/25/16: sinus rhythm. Rate 60 bpm. Nonspecific T wave abnormalities.  LHC 01/11/2020: Prox Cx lesion is 50% stenosed. Not significant by DFR, 1.0. Mid LAD lesion is 40% stenosed, not significant by FFR with IV adenosine, 0.83. The left ventricular systolic function is normal. LV end diastolic pressure is normal. The left ventricular ejection fraction is 55-65% by visual estimate. There is no aortic valve stenosis.   Continue aggressive risk factor modification.  Plan discharge later today.  Coronary CTA 01/02/2020: IMPRESSION: 1. Coronary  calcium score of 741. This was 90th percentile for age and sex matched control.   2. Normal coronary origin with co-dominance.   3. Heavily calcified vessels with concern for obstructive disease in the proximal LCX, OM1 (>70%) and moderate disease (50-69% in the proximal LAD and D2.   4.  Will send study for FFRct.   5.  Mild calcification of the aortic root.  Recent Labs: 08/03/2022: ALT 16; BUN 17; Creatinine, Ser 1.05; Potassium 4.3; Sodium 142   Lipid Panel    Component Value Date/Time   CHOL 267 (H) 08/03/2022 0948   TRIG 200 (H) 08/03/2022 0948   HDL 69 08/03/2022 0948   CHOLHDL 3.9 08/03/2022 0948   CHOLHDL 5.3 (H) 12/28/2016 0924   VLDL 49 (H) 12/28/2016 0924   LDLCALC 162 (H) 08/03/2022 0948   06/03/16:  WBC 5.2, hemoglobin of 0.4, hematocrit 37.4, platelets 222 Sodium 140,  potassium 4.3 BUN 20, creatinine 0.96 AST 24, ALT 22 TSH 0.37 Total cholesterol 341, triglycerides 334, HDL 58, LDL 217  Wt Readings from Last 3 Encounters:  08/12/22 170 lb (77.1 kg)  07/28/22 169 lb 6.4 oz (76.8 kg)  02/10/22 173 lb 12.8 oz (78.8 kg)     ASSESSMENT AND PLAN:  Coronary artery calcification seen on computed tomography Non-obstructive CAD. She has no ischemic symptoms.  Continue aspirin.  She starts inclisiran latera this week.  LDL goal is <70.  Appreciate Dr. Lysbeth Penner assistance with this.    Murmur New murmur noted on exam.  We will get an echo to assess.  Lp(a) is elevated, which raises the risk of aortic stenosis.  Hyperlipidemia Starting Inclisiran as above.  LDL goal <70.  Essential hypertension Blood pressures well controlled on amlodipine.  She is no longer having dizziness or swelling since the dose was reduced.   Disposition:   FU with Alysandra Lobue C. Oval Linsey, MD, [facility] in 1 year.  Current medicines are reviewed at length with the patient today.  The patient does not have concerns regarding medicines.  The following changes have been made: none  Labs/ tests ordered today include:   No orders of the defined types were placed in this encounter.   I,Mathew Stumpf,acting as a Education administrator for Skeet Latch, MD.,have documented all relevant documentation on the behalf of Skeet Latch, MD,as directed by  Skeet Latch, MD while in the presence of Skeet Latch, MD.  I, [place] Oval Linsey, MD have reviewed all documentation for this visit.  The documentation of the exam, diagnosis, procedures, and orders on 08/12/2022 are all accurate and complete.   Signed, Isley Weisheit C. Oval Linsey, MD, [facility]  08/12/2022 8:30 AM    [place]

## 2022-08-12 NOTE — Patient Instructions (Signed)
Medication Instructions:  Your physician recommends that you continue on your current medications as directed. Please refer to the Current Medication list given to you today.   *If you need a refill on your cardiac medications before your next appointment, please call your pharmacy*  Lab Work: NONE   Testing/Procedures: Your physician has requested that you have an echocardiogram. Echocardiography is a painless test that uses sound waves to create images of your heart. It provides your doctor with information about the size and shape of your heart and how well your heart's chambers and valves are working. This procedure takes approximately one hour. There are no restrictions for this procedure.  Follow-Up: At [facility], you and your health needs are our priority.  As part of our continuing mission to provide you with exceptional heart care, we have created designated Provider Care Teams.  These Care Teams include your primary Cardiologist (physician) and Advanced Practice Providers (APPs -  Physician Assistants and Nurse Practitioners) who all work together to provide you with the care you need, when you need it.  We recommend signing up for the patient portal called "MyChart".  Sign up information is provided on this After Visit Summary.  MyChart is used to connect with patients for Virtual Visits (Telemedicine).  Patients are able to view lab/test results, encounter notes, upcoming appointments, etc.  Non-urgent messages can be sent to your provider as well.   To learn more about what you can do with MyChart, go to [email or web address].    Your next appointment:   12 month(s)  The format for your next appointment:   In Person  Provider:   Skeet Latch, MD

## 2022-08-12 NOTE — Assessment & Plan Note (Signed)
Non-obstructive CAD. She has no ischemic symptoms.  Continue aspirin.  She starts inclisiran latera this week.  LDL goal is <70.  Appreciate Dr. Lysbeth Penner assistance with this.

## 2022-08-14 ENCOUNTER — Ambulatory Visit (INDEPENDENT_AMBULATORY_CARE_PROVIDER_SITE_OTHER): Payer: Medicare Other

## 2022-08-14 VITALS — BP 151/72 | HR 61 | Temp 97.5°F | Resp 16 | Ht 60.0 in | Wt 171.2 lb

## 2022-08-14 DIAGNOSIS — G459 Transient cerebral ischemic attack, unspecified: Secondary | ICD-10-CM | POA: Diagnosis not present

## 2022-08-14 DIAGNOSIS — I2510 Atherosclerotic heart disease of native coronary artery without angina pectoris: Secondary | ICD-10-CM

## 2022-08-14 DIAGNOSIS — E782 Mixed hyperlipidemia: Secondary | ICD-10-CM | POA: Diagnosis not present

## 2022-08-14 MED ORDER — INCLISIRAN SODIUM 284 MG/1.5ML ~~LOC~~ SOSY
284.0000 mg | PREFILLED_SYRINGE | Freq: Once | SUBCUTANEOUS | Status: AC
  Administered 2022-08-14: 284 mg via SUBCUTANEOUS
  Filled 2022-08-14: qty 1.5

## 2022-08-14 NOTE — Progress Notes (Signed)
Diagnosis: Hyperlipidemia  Provider:  Marshell Garfinkel MD  Procedure: Injection  Leqvio (inclisiran), Dose: 284 mg , Site: subcutaneous, Number of injections: 1 30 minute observation period completed.   Discharge: Condition: Good, Destination: Home . AVS provided to patient.   Performed by:  Adelina Mings, LPN

## 2022-08-18 ENCOUNTER — Emergency Department (HOSPITAL_BASED_OUTPATIENT_CLINIC_OR_DEPARTMENT_OTHER)
Admission: EM | Admit: 2022-08-18 | Discharge: 2022-08-18 | Disposition: A | Discharge status: Home / Self Care | Payer: Medicare Other | Attending: Emergency Medicine | Admitting: Emergency Medicine

## 2022-08-18 ENCOUNTER — Emergency Department (HOSPITAL_BASED_OUTPATIENT_CLINIC_OR_DEPARTMENT_OTHER): Payer: Medicare Other | Admitting: Radiology

## 2022-08-18 ENCOUNTER — Other Ambulatory Visit: Payer: Self-pay

## 2022-08-18 ENCOUNTER — Encounter (HOSPITAL_BASED_OUTPATIENT_CLINIC_OR_DEPARTMENT_OTHER): Payer: Self-pay

## 2022-08-18 DIAGNOSIS — Z7982 Long term (current) use of aspirin: Secondary | ICD-10-CM | POA: Insufficient documentation

## 2022-08-18 DIAGNOSIS — M79672 Pain in left foot: Secondary | ICD-10-CM | POA: Diagnosis not present

## 2022-08-18 DIAGNOSIS — M7732 Calcaneal spur, left foot: Secondary | ICD-10-CM | POA: Diagnosis not present

## 2022-08-18 NOTE — ED Provider Notes (Signed)
[place] EMERGENCY DEPT Provider Note   CSN: [id number] Arrival date & time: 08/18/22  1029     History  Chief Complaint  Patient presents with   Foot Pain    Kym TONA QUALLEY is a 76 y.o. female.  She is here with a complaint of left foot and ankle pain that started last evening.  She said it was causing difficulty walking.  No known trauma.  No numbness or weakness.  She said the veins were dilated across the top of the foot.  She said she had a fracture there about 5 years ago.  No other injuries or complaints.  The history is provided by the patient.  Foot Pain This is a new problem. The current episode started yesterday. The problem occurs constantly. The problem has not changed since onset.Pertinent negatives include no chest pain, no abdominal pain, no headaches and no shortness of breath. The symptoms are aggravated by walking. Nothing relieves the symptoms. She has tried rest for the symptoms. The treatment provided no relief.       Home Medications Prior to Admission medications   Medication Sig Start Date End Date Taking? Authorizing Provider  amLODipine (NORVASC) 2.5 MG tablet Take 1 tablet (2.5 mg total) by mouth daily. 02/05/22   Skeet Latch, MD  aspirin EC 81 MG tablet Take 81 mg by mouth daily. Swallow whole.    [provider]  levothyroxine (SYNTHROID) 75 MCG tablet Take 75 mcg by mouth every morning. 06/23/22   [provider]  sertraline (ZOLOFT) 25 MG tablet Take 25 mg by mouth daily.    [provider]      Allergies    Atorvastatin, Codeine, Demerol [meperidine], Penicillins, Pravastatin, Repatha [evolocumab], and Rosuvastatin    Review of Systems   Review of Systems  Constitutional:  Negative for fever.  Respiratory:  Negative for shortness of breath.   Cardiovascular:  Negative for chest pain.  Gastrointestinal:  Negative for abdominal pain.  Skin:  Negative for wound.  Neurological:  Negative for  weakness, numbness and headaches.    Physical Exam Updated Vital Signs BP 137/79 (BP Location: Left Arm)   Pulse 60   Temp 98.3 F (36.8 C) (Oral)   Resp 16   Ht 5' (1.524 m)   Wt 77.6 kg   SpO2 97%   BMI 33.40 kg/m  Physical Exam Constitutional:      Appearance: Normal appearance. She is well-developed.  HENT:     Head: Normocephalic and atraumatic.  Eyes:     Conjunctiva/sclera: Conjunctivae normal.  Musculoskeletal:        General: Tenderness present. Normal range of motion.     Cervical back: Neck supple.     Comments: She has diffuse tenderness over her left ankle and left midfoot.  Moves digits easily.  Normal cap refill normal pulses.  Sensation and motor intact.  No gross instability.  No open wounds or foreign body appreciated.  Skin:    General: Skin is warm and dry.  Neurological:     General: No focal deficit present.     Mental Status: She is alert.     GCS: GCS eye subscore is 4. GCS verbal subscore is 5. GCS motor subscore is 6.     Sensory: No sensory deficit.     Motor: No weakness.     ED Results / Procedures / Treatments   Labs (all labs ordered are listed, but only abnormal results are displayed) Labs Reviewed - No data  to display  EKG None  Radiology DG Ankle Complete Left  Result Date: 08/18/2022 CLINICAL DATA:  Left foot pain. EXAM: LEFT ANKLE COMPLETE - 3+ VIEW COMPARISON:  Left foot 08/18/2022 FINDINGS: Negative for fracture or dislocation in left ankle. Normal alignment. No focal soft tissue abnormality. Calcaneal spurring at the Achilles tendon insertion site. IMPRESSION: 1. No acute bone abnormality to the left ankle. 2. Calcaneal spurring. Electronically Signed   By: Markus Daft M.D.   On: 08/18/2022 12:34   DG Foot Complete Left  Result Date: 08/18/2022 CLINICAL DATA:  Left foot pain over arch and dorsal surface. No injury. History of stress fracture. EXAM: LEFT FOOT - COMPLETE 3+ VIEW COMPARISON:  None Available. FINDINGS: No evidence  of acute fracture or dislocation. No significant degenerative changes. Soft tissues are unremarkable. IMPRESSION: No acute findings. Electronically Signed   By: Marin Olp M.D.   On: 08/18/2022 11:53    Procedures Procedures    Medications Ordered in ED Medications - No data to display  ED Course/ Medical Decision Making/ A&P                           Medical Decision Making Amount and/or Complexity of Data Reviewed Radiology: ordered.   76 year old female with atraumatic left foot and ankle pain.  Exam does show some diffuse tenderness over her midfoot.  No signs of cellulitis.  Differential includes musculoskeletal pain, fracture, dislocation, ligament strain, planter fasciitis, cellulitis, DVT.  X-rays were interpreted by me as no acute fracture.  We will give contact information for podiatry and have her continue symptomatic treatment with ice and ibuprofen.  Return instructions discussed        Final Clinical Impression(s) / ED Diagnoses Final diagnoses:  Left foot pain    Rx / DC Orders ED Discharge Orders     None         Hayden Rasmussen, MD 08/18/22 1816

## 2022-08-18 NOTE — Discharge Instructions (Signed)
You are seen in the emergency department for left foot and ankle pain.  You had x-rays of your foot and ankle that did not show any obvious fracture.  Please ice and elevate.  Ibuprofen.  Contact podiatry for outpatient follow-up.  Return to the emergency department if any worsening or concerning symptoms.

## 2022-08-18 NOTE — ED Triage Notes (Signed)
Pt states left foot feels pressure in arch and tenderness/swollen on top. Pt has hx of hairline fracture. Pt states pain started yesterday. Denies injury/trauma.

## 2022-08-19 ENCOUNTER — Other Ambulatory Visit: Payer: Self-pay | Admitting: *Deleted

## 2022-08-19 ENCOUNTER — Encounter: Payer: Self-pay | Admitting: Internal Medicine

## 2022-08-19 DIAGNOSIS — E785 Hyperlipidemia, unspecified: Secondary | ICD-10-CM

## 2022-08-26 ENCOUNTER — Encounter: Payer: Self-pay | Admitting: Internal Medicine

## 2022-08-26 DIAGNOSIS — E041 Nontoxic single thyroid nodule: Secondary | ICD-10-CM | POA: Diagnosis not present

## 2022-08-31 ENCOUNTER — Ambulatory Visit: Payer: Medicare Other | Admitting: Podiatry

## 2022-09-30 ENCOUNTER — Telehealth: Payer: Self-pay | Admitting: Internal Medicine

## 2022-09-30 NOTE — Telephone Encounter (Signed)
  Pt c/o medication issue:  1. Name of Medication: leqvio shots   2. How are you currently taking this medication (dosage and times per day)?   3. Are you having a reaction (difficulty breathing--STAT)?   4. What is your medication issue? Pt said, her next shots of leqvio is on 11/13/22 but she will be in [place], [place]. She would like to know if she can just get the shot over there

## 2022-09-30 NOTE — Telephone Encounter (Signed)
Returned call to patient-she is going to be out of town when she is due for her next [employer] shot.   She will be out of town until end of March.   She is wondering if there is anyway to arrange this in [place] if she finds somewhere that can do this or if she will need to fly home for her shot.    Advised would forward to Dr. Lysbeth Penner nurse to assist.

## 2022-10-01 NOTE — Telephone Encounter (Signed)
Spoke with patient who reports she will be out of town 12/22 and her leqvio injection is 12/29  Advised to call [facility] and r/s her appt for the week prior.   She said she will be back in [place] before her 3/18 echo  Advised that she do fasting labs (for her 3/22 appt with Hilty MD) the same day as echo

## 2022-10-06 ENCOUNTER — Ambulatory Visit (INDEPENDENT_AMBULATORY_CARE_PROVIDER_SITE_OTHER): Payer: Medicare Other | Admitting: *Deleted

## 2022-10-06 VITALS — BP 157/84 | HR 63 | Temp 97.5°F | Resp 16 | Ht 60.0 in | Wt 170.6 lb

## 2022-10-06 DIAGNOSIS — E782 Mixed hyperlipidemia: Secondary | ICD-10-CM | POA: Diagnosis not present

## 2022-10-06 DIAGNOSIS — G459 Transient cerebral ischemic attack, unspecified: Secondary | ICD-10-CM | POA: Diagnosis not present

## 2022-10-06 DIAGNOSIS — I2510 Atherosclerotic heart disease of native coronary artery without angina pectoris: Secondary | ICD-10-CM | POA: Diagnosis not present

## 2022-10-06 MED ORDER — INCLISIRAN SODIUM 284 MG/1.5ML ~~LOC~~ SOSY
284.0000 mg | PREFILLED_SYRINGE | Freq: Once | SUBCUTANEOUS | Status: AC
  Administered 2022-10-06: 284 mg via SUBCUTANEOUS
  Filled 2022-10-06: qty 1.5

## 2022-10-06 NOTE — Progress Notes (Signed)
Diagnosis: Hyperlipidemia  Provider:  Marshell Garfinkel MD  Procedure:INjection  Leqvio (inclisiran), Dose: 284 mg, Site: subcutaneous, Number of injections: 1  Post Care: Observation period completed  Discharge: Condition: Good, Destination: Home . AVS provided to patient.   Performed by:  Oren Beckmann, RN

## 2022-11-13 ENCOUNTER — Ambulatory Visit: Payer: TRICARE For Life (TFL)

## 2022-12-16 ENCOUNTER — Other Ambulatory Visit: Payer: Self-pay | Admitting: Internal Medicine

## 2022-12-16 DIAGNOSIS — E785 Hyperlipidemia, unspecified: Secondary | ICD-10-CM

## 2023-01-26 DIAGNOSIS — E785 Hyperlipidemia, unspecified: Secondary | ICD-10-CM | POA: Diagnosis not present

## 2023-01-31 LAB — NMR, LIPOPROFILE
Cholesterol, Total: 218 mg/dL — ABNORMAL HIGH (ref 100–199)
HDL Particle Number: 29.5 umol/L — ABNORMAL LOW (ref >=30.5)
HDL-C: 63 mg/dL (ref >39)
LDL Particle Number: 1256 nmol/L — ABNORMAL HIGH (ref <1000)
LDL Size: 20.7 nm (ref >20.5)
LDL-C (NIH Calc): 126 mg/dL — ABNORMAL HIGH (ref 0–99)
LP-IR Score: <25 (ref <=45)
Small LDL Particle Number: 387 nmol/L (ref <=527)
Triglycerides: 166 mg/dL — ABNORMAL HIGH (ref 0–149)

## 2023-01-31 LAB — LIPOPROTEIN A (LPA): Lipoprotein (a): <8.4 nmol/L (ref <75.0)

## 2023-02-01 ENCOUNTER — Ambulatory Visit (INDEPENDENT_AMBULATORY_CARE_PROVIDER_SITE_OTHER): Payer: Medicare Other

## 2023-02-01 DIAGNOSIS — R011 Cardiac murmur, unspecified: Secondary | ICD-10-CM | POA: Diagnosis not present

## 2023-02-01 DIAGNOSIS — I10 Essential (primary) hypertension: Secondary | ICD-10-CM | POA: Diagnosis not present

## 2023-02-01 LAB — ECHOCARDIOGRAM COMPLETE
Area-P 1/2: 3.6 cm2
MV M vel: 2.42 m/s
MV Peak grad: 23.4 mmHg
S' Lateral: 2.24 cm

## 2023-02-05 ENCOUNTER — Ambulatory Visit: Payer: Medicare Other | Attending: Internal Medicine | Admitting: Internal Medicine

## 2023-02-05 ENCOUNTER — Encounter: Payer: Self-pay | Admitting: Internal Medicine

## 2023-02-05 VITALS — BP 139/73 | HR 63 | Ht 60.0 in | Wt 169.2 lb

## 2023-02-05 DIAGNOSIS — M791 Myalgia, unspecified site: Secondary | ICD-10-CM | POA: Diagnosis not present

## 2023-02-05 DIAGNOSIS — R011 Cardiac murmur, unspecified: Secondary | ICD-10-CM | POA: Diagnosis not present

## 2023-02-05 DIAGNOSIS — E7841 Elevated Lipoprotein(a): Secondary | ICD-10-CM | POA: Diagnosis not present

## 2023-02-05 DIAGNOSIS — I2510 Atherosclerotic heart disease of native coronary artery without angina pectoris: Secondary | ICD-10-CM | POA: Insufficient documentation

## 2023-02-05 DIAGNOSIS — E785 Hyperlipidemia, unspecified: Secondary | ICD-10-CM | POA: Diagnosis not present

## 2023-02-05 DIAGNOSIS — T466X5A Adverse effect of antihyperlipidemic and antiarteriosclerotic drugs, initial encounter: Secondary | ICD-10-CM | POA: Diagnosis not present

## 2023-02-05 DIAGNOSIS — G459 Transient cerebral ischemic attack, unspecified: Secondary | ICD-10-CM | POA: Diagnosis not present

## 2023-02-05 DIAGNOSIS — T466X5D Adverse effect of antihyperlipidemic and antiarteriosclerotic drugs, subsequent encounter: Secondary | ICD-10-CM | POA: Diagnosis not present

## 2023-02-05 NOTE — Patient Instructions (Signed)
Medication Instructions:  NO CHANGES  *If you need a refill on your cardiac medications before your next appointment, please call your pharmacy*   Lab Work: FASTING lab work to check cholesterol about one week before next appointment  If you have labs (blood work) drawn today and your tests are completely normal, you will receive your results only by: [place] (if you have MyChart) OR A paper copy in the mail If you have any lab test that is abnormal or we need to change your treatment, we will call you to review the results.   Follow-Up: At [facility], you and your health needs are our priority.  As part of our continuing mission to provide you with exceptional heart care, we have created designated Provider Care Teams.  These Care Teams include your primary Cardiologist (physician) and Advanced Practice Providers (APPs -  Physician Assistants and Nurse Practitioners) who all work together to provide you with the care you need, when you need it.  We recommend signing up for the patient portal called "MyChart".  Sign up information is provided on this After Visit Summary.  MyChart is used to connect with patients for Virtual Visits (Telemedicine).  Patients are able to view lab/test results, encounter notes, upcoming appointments, etc.  Non-urgent messages can be sent to your provider as well.   To learn more about what you can do with MyChart, go to [email or web address].    Your next appointment:    July 2024 with Dr. Debara Pickett

## 2023-02-05 NOTE — Progress Notes (Signed)
LIPID CLINIC CONSULT NOTE  Chief Complaint:  Follow-up dyslipidemia  Primary Care Physician: Johna Roles, PA  Primary Cardiologist:  Skeet Latch, MD  HPI:  [name] is a 77 y.o. female who is being seen today for the evaluation of dyslipidemia at the request of Clelland, Olivia M, [place]. this is a pleasant 77 year old female kindly referred for evaluation and management of dyslipidemia.  She is a patient of Dr. Oval Linsey.  She was just seen about a week ago and had a LDL particle number and early March of 1221, LDL-C of 124, HDL-C of 57, triglycerides 197 and small LDL particle number of 344.  Lp(a) was also elevated at 101.3.  She has been intolerant to statins including atorvastatin, pravastatin and rosuvastatin in the past.  She had been started on Repatha.  It seemed that she was not taking the medicine consistently when her lipids were rechecked and plan was to repeat the lipids in about 6 months.  07/29/2022  [name] returns today for follow-up of dyslipidemia.  She reports that she has not been consistently taking Repatha due to concern about side effects.  She has not had recent lipids rechecked.  Her last labs were in April 2023 which showed a persistently elevated cholesterol 218, triglycerides 212, HDL 64 and LDL 118.  02/05/2023  [name] returns today for follow-up.  She had repeat lipid testing.  Surprisingly her LP(a) now is undetectable.  This is unusual even on Leqvio.  Her LDL particle number has come down to 1256 with an LDL-C of 126.  This response, however is not significant improvement compared to her labs in the past.  In fact, her cholesterol was similar to this about a year ago.  She may have been on Repatha at that time but did not tolerate it.  Her LDL particle number 6 months ago however was 1855 with an LDL-C of 168.  Based on these numbers, it appears she is only had about a 25% reduction in her cholesterol which is half of what is  expected.  She has already had 2 injections and has another Leqvio injection scheduled in May.  PMHx:  Past Medical History:  Diagnosis Date   Colonoscopy causing post-procedural bleeding    Coronary artery calcification seen on computed tomography    Endometriosis    Essential hypertension 07/02/2021   Essential hypertension 07/02/2021   Hyperlipidemia    Murmur 08/12/2022   Myalgia 11/20/2016   Obesity 08/25/2016   Shortness of breath 07/21/2017   Thyroid disease    TIA (transient ischemic attack) 07/21/2017    Past Surgical History:  Procedure Laterality Date   CARDIAC CATHETERIZATION     INTRAVASCULAR PRESSURE WIRE/FFR STUDY N/A 01/11/2020   Procedure: INTRAVASCULAR PRESSURE WIRE/FFR STUDY;  Surgeon: Jettie Booze, MD;  Location: [facility];  Service: Cardiovascular;  Laterality: N/A;   LEFT HEART CATH AND CORONARY ANGIOGRAPHY N/A 01/11/2020   Procedure: LEFT HEART CATH AND CORONARY ANGIOGRAPHY;  Surgeon: Jettie Booze, MD;  Location: [facility];  Service: Cardiovascular;  Laterality: N/A;    FAMHx:  Family History  Problem Relation Age of Onset   CAD Mother    Dementia Mother    CAD Father    CAD Sister    Kidney disease Brother    Dementia Brother    Lymphoma Sister    Breast cancer Neg Hx     SOCHx:   reports that she has quit smoking. Her  smoking use included cigarettes. She smoked an average of 1 pack per day. She has never used smokeless tobacco. She reports current alcohol use. She reports that she does not use drugs.  ALLERGIES:  Allergies  Allergen Reactions   Atorvastatin    Codeine    Demerol [Meperidine]    Penicillins Hives   Pravastatin    Repatha [Evolocumab]    Rosuvastatin     ROS: Pertinent items noted in HPI and remainder of comprehensive ROS otherwise negative.  HOME MEDS: Current Outpatient Medications on File Prior to Visit  Medication Sig Dispense Refill   amLODipine (NORVASC) 2.5 MG tablet Take 1 tablet (2.5  mg total) by mouth daily. (Patient taking differently: Take 5 mg by mouth daily.) 90 tablet 3   levothyroxine (SYNTHROID) 75 MCG tablet Take 75 mcg by mouth every morning.     sertraline (ZOLOFT) 25 MG tablet Take 25 mg by mouth daily.     No current facility-administered medications on file prior to visit.    LABS/IMAGING: No results found for this or any previous visit (from the past 48 hour(s)). No results found.  LIPID PANEL:    Component Value Date/Time   CHOL 267 (H) 08/03/2022 0948   TRIG 200 (H) 08/03/2022 0948   HDL 69 08/03/2022 0948   CHOLHDL 3.9 08/03/2022 0948   CHOLHDL 5.3 (H) 12/28/2016 0924   VLDL 49 (H) 12/28/2016 0924   LDLCALC 162 (H) 08/03/2022 0948    WEIGHTS: Wt Readings from Last 3 Encounters:  02/05/23 169 lb 3.2 oz (76.7 kg)  10/06/22 170 lb 9.6 oz (77.4 kg)  08/18/22 171 lb (77.6 kg)    VITALS: BP 139/73   Pulse 63   Ht 5' (1.524 m)   Wt 169 lb 3.2 oz (76.7 kg)   SpO2 97%   BMI 33.04 kg/m   EXAM: Deferred  EKG: Deferred  ASSESSMENT: Mixed dyslipidemia, goal LDL less than 70 Elevated LP(a) at 101 Statin intolerance-myalgias History of TIA Hypertension Coronary artery calcification by CT  PLAN: 1.   [name] has had a suboptimal response with regards to her Leqvio.  Surprisingly her LP(a) has come back undetectable which is unusual given the fact that her cholesterol is not come down much.  I would like for her to have her third shot in May and we will plan to repeat lipids about 2 months after that in follow-up.  Will also repeat an LP(a) at that time.  She may need additional therapy.  She also inquired about her echocardiogram results.  This was recently performed and ordered by Dr. Oval Linsey back in the fall to evaluate murmur.  It showed LVEF 65 to [id number], grade 1 diastolic dysfunction and aortic sclerosis which is the likely cause of murmur.  I shared these results with her today.  I have encouraged her to follow-up with Dr. Oval Linsey  and we will schedule her follow-up with me this summer.  Pixie Casino, MD, [facility], [place] Director of the Advanced Lipid Disorders &  Cardiovascular Risk Reduction Clinic Diplomate of the American Board of Clinical Lipidology Attending Cardiologist  Direct Dial: [phone]  Fax: [phone]  Website:  [email or web address] 02/05/2023, 8:27 AM

## 2023-03-10 DIAGNOSIS — J209 Acute bronchitis, unspecified: Secondary | ICD-10-CM | POA: Diagnosis not present

## 2023-03-15 DIAGNOSIS — E559 Vitamin D deficiency, unspecified: Secondary | ICD-10-CM | POA: Diagnosis not present

## 2023-03-15 DIAGNOSIS — E2839 Other primary ovarian failure: Secondary | ICD-10-CM | POA: Diagnosis not present

## 2023-03-15 DIAGNOSIS — E7800 Pure hypercholesterolemia, unspecified: Secondary | ICD-10-CM | POA: Diagnosis not present

## 2023-03-15 DIAGNOSIS — E039 Hypothyroidism, unspecified: Secondary | ICD-10-CM | POA: Diagnosis not present

## 2023-03-15 DIAGNOSIS — R011 Cardiac murmur, unspecified: Secondary | ICD-10-CM | POA: Diagnosis not present

## 2023-03-15 DIAGNOSIS — M792 Neuralgia and neuritis, unspecified: Secondary | ICD-10-CM | POA: Diagnosis not present

## 2023-03-15 DIAGNOSIS — F4321 Adjustment disorder with depressed mood: Secondary | ICD-10-CM | POA: Diagnosis not present

## 2023-03-15 DIAGNOSIS — Z1231 Encounter for screening mammogram for malignant neoplasm of breast: Secondary | ICD-10-CM | POA: Diagnosis not present

## 2023-03-15 DIAGNOSIS — M48062 Spinal stenosis, lumbar region with neurogenic claudication: Secondary | ICD-10-CM | POA: Diagnosis not present

## 2023-03-15 DIAGNOSIS — Z8739 Personal history of other diseases of the musculoskeletal system and connective tissue: Secondary | ICD-10-CM | POA: Diagnosis not present

## 2023-03-15 DIAGNOSIS — Z0000 Encounter for general adult medical examination without abnormal findings: Secondary | ICD-10-CM | POA: Diagnosis not present

## 2023-03-15 DIAGNOSIS — I10 Essential (primary) hypertension: Secondary | ICD-10-CM | POA: Diagnosis not present

## 2023-03-16 ENCOUNTER — Other Ambulatory Visit: Payer: Self-pay | Admitting: Physician Assistant

## 2023-03-16 DIAGNOSIS — E2839 Other primary ovarian failure: Secondary | ICD-10-CM

## 2023-03-16 DIAGNOSIS — Z8739 Personal history of other diseases of the musculoskeletal system and connective tissue: Secondary | ICD-10-CM

## 2023-03-16 DIAGNOSIS — Z1231 Encounter for screening mammogram for malignant neoplasm of breast: Secondary | ICD-10-CM

## 2023-03-21 IMAGING — MR MR LUMBAR SPINE W/O CM
4 of 5 series · 18 of 48 positions shown · non-contrast
Comparison: Lumbar MRI 11/22/2017

CLINICAL DATA: Left leg numbness.  Low back pain.

EXAM:
MRI LUMBAR SPINE WITHOUT CONTRAST
TECHNIQUE: Multiplanar, multisequence MR imaging of the lumbar spine was
performed. No intravenous contrast was administered.

[Series 6: T2 · sagittal · 4.0mm · 0.73mm/px · 6 of 16 slices shown (1 of 2)]
[im 1/16]
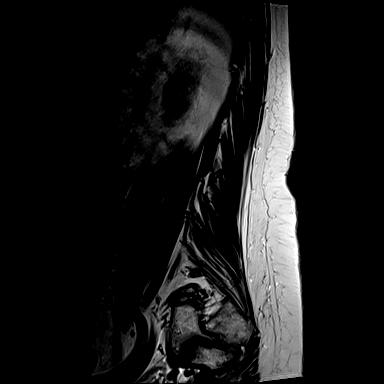
[im 4/16]
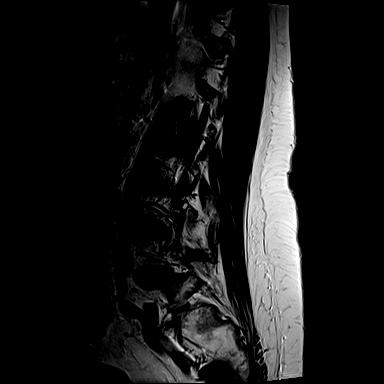
[im 7/16]
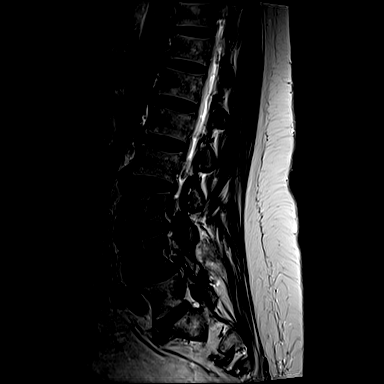
[im 10/16]
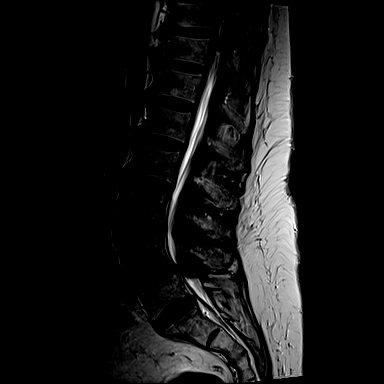
[im 13/16]
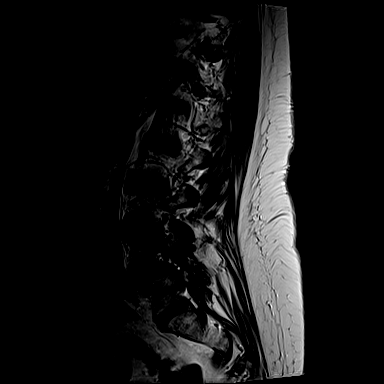
[im 16/16]
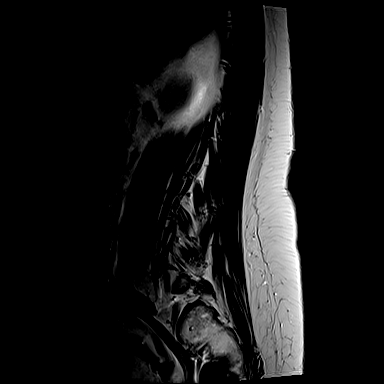

[Series 7: T1 · sagittal · 4.0mm · 0.73mm/px · 3 of 16 slices shown (1 of 2)]
[im 4/16]
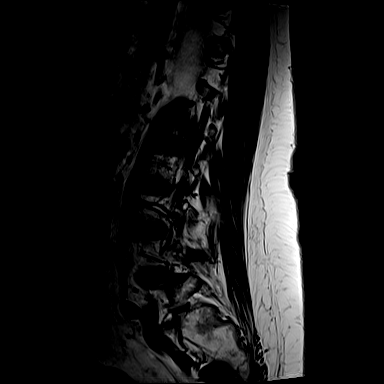
[im 10/16]
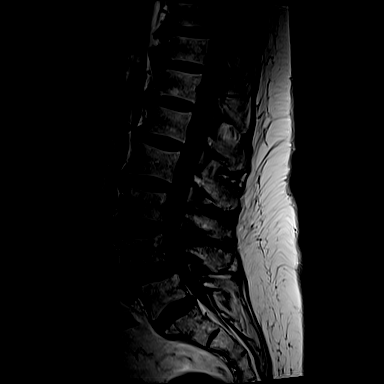
[im 16/16]
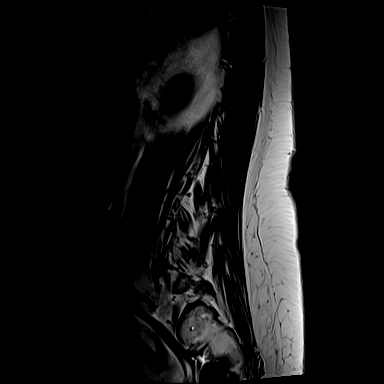

[Series 13: T2 · axial · 4.0mm · 0.28mm/px · z∈[-111,+73]mm · 6 of 39 slices shown (2 of 2)]
[im 1/39]
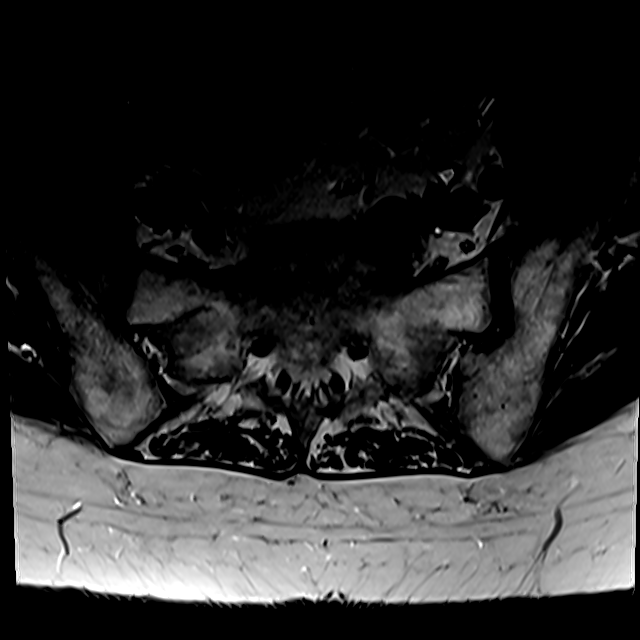
[im 6/39]
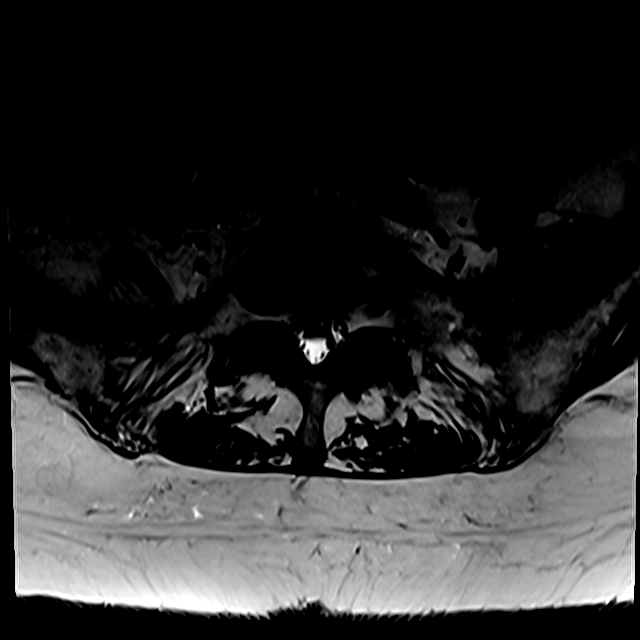
[im 11/39]
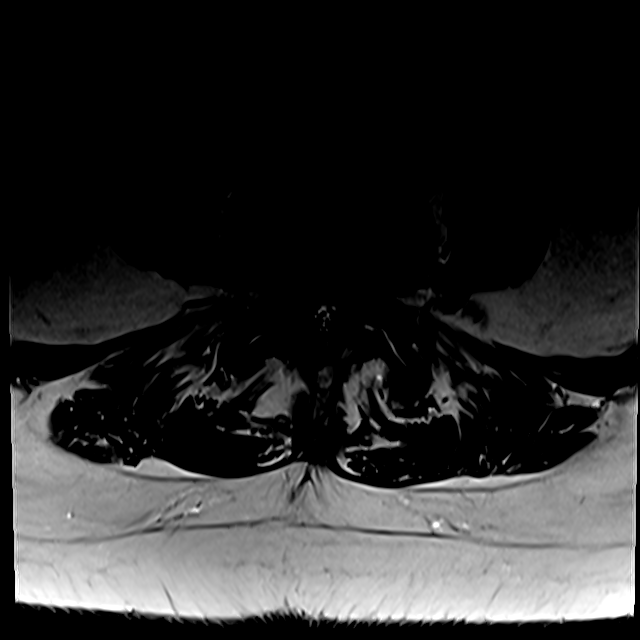
[im 17/39]
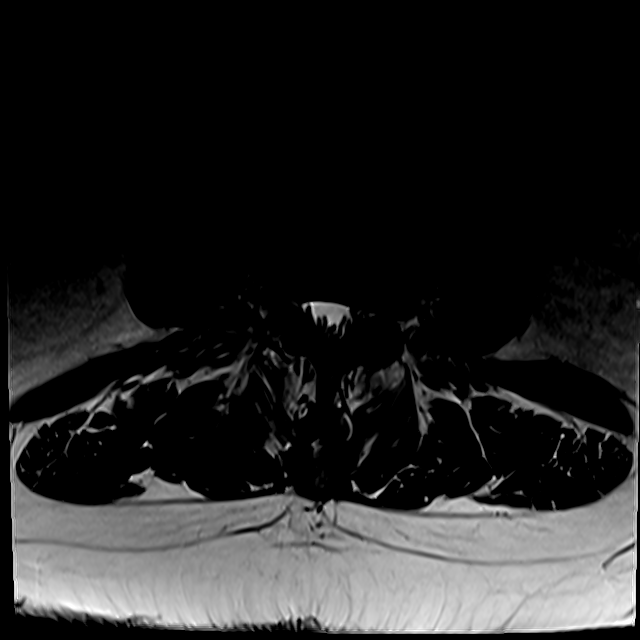
[im 20/39]
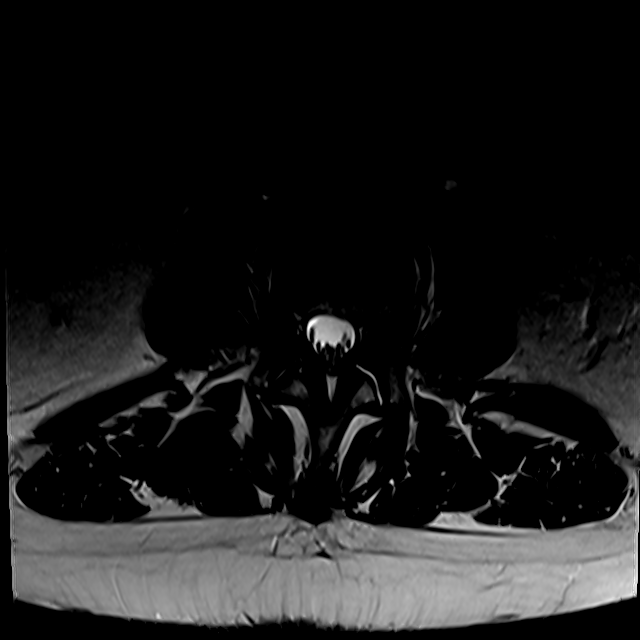
[im 33/39]
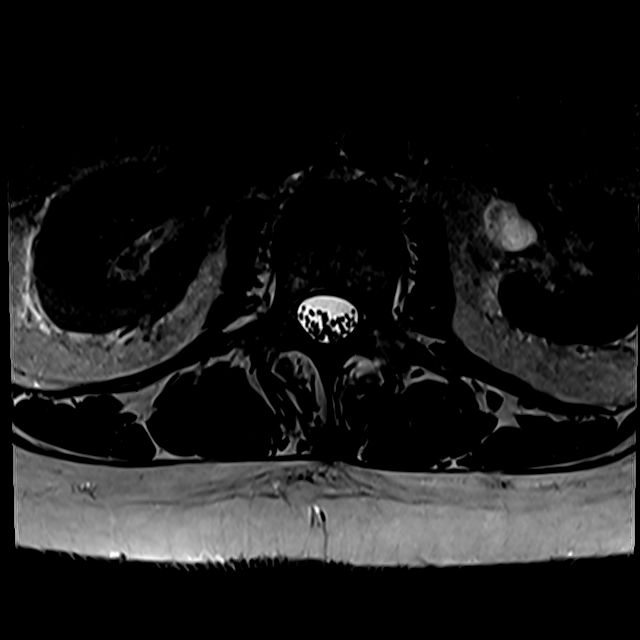

[Series 100: T1 · axial · 4.0mm · 0.28mm/px · z∈[-87,+73]mm · 3 of 39 slices shown (2 of 2)]
[im 6/39]
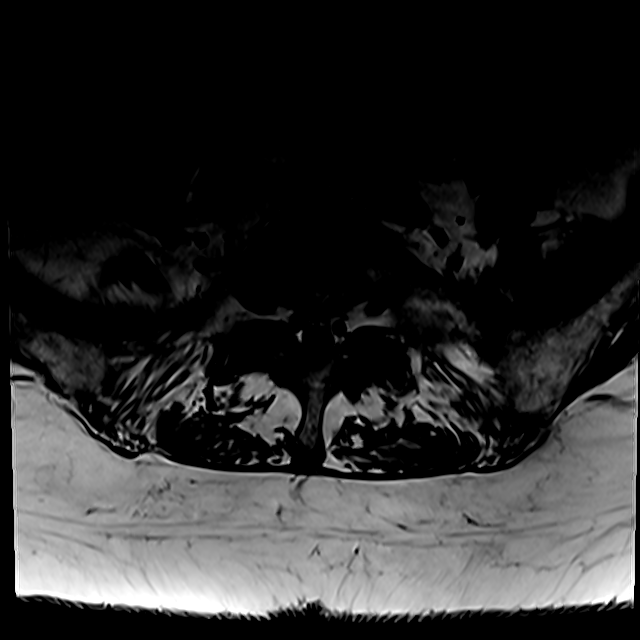
[im 20/39]
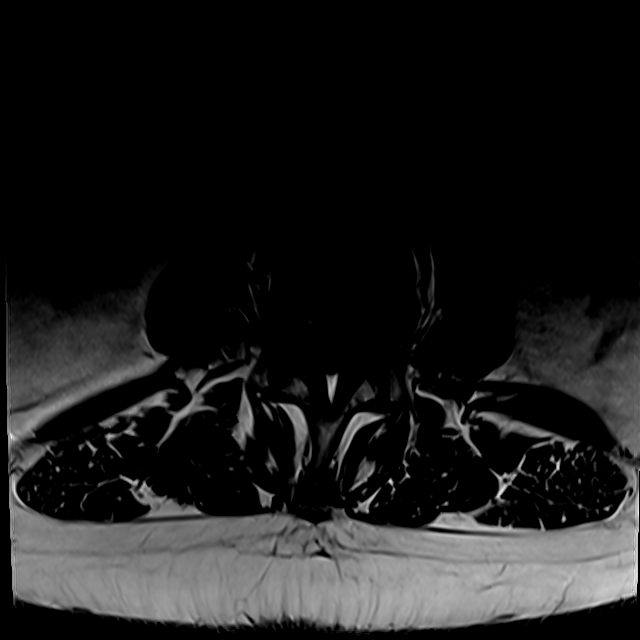
[im 33/39]
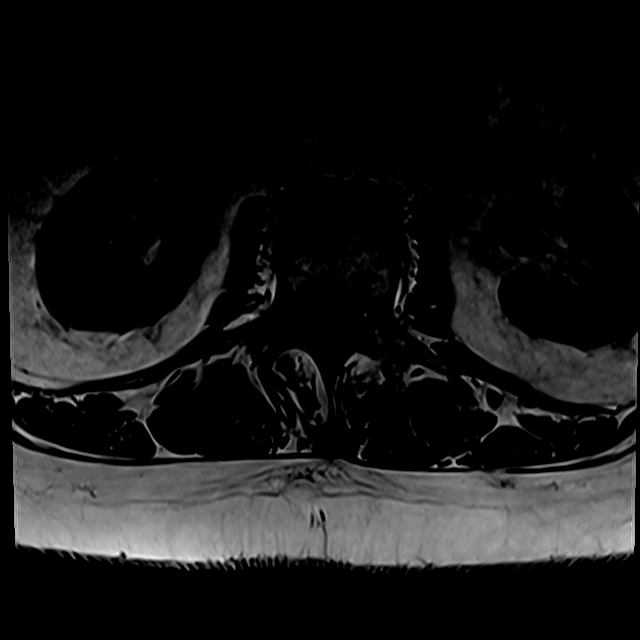

[18 of 48 positions shown; findings below may reference images not displayed]

FINDINGS: Segmentation:  Normal

Alignment: 6 mm anterolisthesis L4-5 unchanged from the prior study.
Remaining alignment normal.

Vertebrae:  Normal bone marrow.  Negative for fracture or mass.

Conus medullaris and cauda equina: Conus extends to the L1 level.
Conus and cauda equina appear normal.

Paraspinal and other soft tissues: Negative for paraspinous mass or
adenopathy.

Disc levels:

T12-L1: Negative

L1-2: Negative

L2-3: Mild disc degeneration and disc bulging. Small left foraminal
disc protrusion similar to the prior study. Mild displacement of the
left L2 nerve root. Mild facet degeneration.

L3-4: Mild disc degeneration and disc space narrowing. Left
foraminal and extraforaminal disc protrusion unchanged with mild
displacement left L3 nerve root. Bilateral facet degeneration.
Subarticular stenosis bilaterally left greater than right unchanged.

L4-5: 6 mm anterolisthesis with severe facet degeneration. Severe
spinal stenosis and severe subarticular stenosis bilaterally
unchanged from the prior study

L5-S1: Normal disc space. Moderate facet hypertrophy bilaterally.
Mild subarticular stenosis bilaterally unchanged.
IMPRESSION: Small left lateral disc protrusions L2-3 and L3-4 unchanged

Severe spinal stenosis L4-5 unchanged.

Overall, findings similar to 4251.

## 2023-04-06 ENCOUNTER — Ambulatory Visit (INDEPENDENT_AMBULATORY_CARE_PROVIDER_SITE_OTHER): Payer: Medicare Other

## 2023-04-06 VITALS — BP 148/77 | HR 63 | Temp 97.9°F | Resp 18 | Ht 60.0 in | Wt 169.2 lb

## 2023-04-06 DIAGNOSIS — E782 Mixed hyperlipidemia: Secondary | ICD-10-CM | POA: Diagnosis not present

## 2023-04-06 DIAGNOSIS — I2510 Atherosclerotic heart disease of native coronary artery without angina pectoris: Secondary | ICD-10-CM | POA: Diagnosis not present

## 2023-04-06 DIAGNOSIS — G459 Transient cerebral ischemic attack, unspecified: Secondary | ICD-10-CM

## 2023-04-06 MED ORDER — INCLISIRAN SODIUM 284 MG/1.5ML ~~LOC~~ SOSY
284.0000 mg | PREFILLED_SYRINGE | Freq: Once | SUBCUTANEOUS | Status: AC
  Administered 2023-04-06: 284 mg via SUBCUTANEOUS
  Filled 2023-04-06: qty 1.5

## 2023-04-06 NOTE — Progress Notes (Signed)
Diagnosis: Hyperlipidemia  Provider:  Chilton Greathouse MD  Procedure: Injection  Leqvio (inclisiran), Dose: 284 mg, Site: subcutaneous, Number of injections: 1  Post Care:  Patient discharged home  Discharge: Condition: Good, Destination: Home . AVS Provided  Performed by:  Marilynn Rail, RN

## 2023-05-27 DIAGNOSIS — E785 Hyperlipidemia, unspecified: Secondary | ICD-10-CM | POA: Diagnosis not present

## 2023-05-31 LAB — NMR, LIPOPROFILE
Cholesterol, Total: 203 mg/dL — ABNORMAL HIGH (ref 100–199)
HDL Particle Number: 32.6 umol/L (ref >=30.5)
HDL-C: 62 mg/dL (ref >39)
LDL Particle Number: 1125 nmol/L — ABNORMAL HIGH (ref <1000)
LDL Size: 20.5 nm — ABNORMAL LOW (ref >20.5)
LDL-C (NIH Calc): 107 mg/dL — ABNORMAL HIGH (ref 0–99)
LP-IR Score: 30 (ref <=45)
Small LDL Particle Number: 421 nmol/L (ref <=527)
Triglycerides: 197 mg/dL — ABNORMAL HIGH (ref 0–149)

## 2023-05-31 LAB — LIPOPROTEIN A (LPA): Lipoprotein (a): 108.5 nmol/L — ABNORMAL HIGH (ref <75.0)

## 2023-06-03 ENCOUNTER — Ambulatory Visit: Payer: Medicare Other | Attending: Internal Medicine | Admitting: Internal Medicine

## 2023-06-03 ENCOUNTER — Encounter: Payer: Self-pay | Admitting: Internal Medicine

## 2023-06-03 VITALS — BP 124/82 | HR 60 | Ht 60.0 in | Wt 167.0 lb

## 2023-06-03 DIAGNOSIS — E785 Hyperlipidemia, unspecified: Secondary | ICD-10-CM | POA: Diagnosis not present

## 2023-06-03 DIAGNOSIS — T466X5D Adverse effect of antihyperlipidemic and antiarteriosclerotic drugs, subsequent encounter: Secondary | ICD-10-CM | POA: Diagnosis not present

## 2023-06-03 DIAGNOSIS — T466X5A Adverse effect of antihyperlipidemic and antiarteriosclerotic drugs, initial encounter: Secondary | ICD-10-CM | POA: Diagnosis not present

## 2023-06-03 DIAGNOSIS — M791 Myalgia, unspecified site: Secondary | ICD-10-CM | POA: Diagnosis not present

## 2023-06-03 DIAGNOSIS — G459 Transient cerebral ischemic attack, unspecified: Secondary | ICD-10-CM | POA: Diagnosis not present

## 2023-06-03 DIAGNOSIS — E7841 Elevated Lipoprotein(a): Secondary | ICD-10-CM | POA: Diagnosis not present

## 2023-06-03 DIAGNOSIS — I2510 Atherosclerotic heart disease of native coronary artery without angina pectoris: Secondary | ICD-10-CM | POA: Diagnosis not present

## 2023-06-03 NOTE — Progress Notes (Signed)
LIPID CLINIC CONSULT NOTE  Chief Complaint:  Follow-up dyslipidemia  Primary Care Physician: Delma Officer, PA  Primary Cardiologist:  Chilton Si, MD  HPI:  [name] is a 77 y.o. female who is being seen today for the evaluation of dyslipidemia at the request of Clelland, Olivia M, [place]. this is a pleasant 78 year old female kindly referred for evaluation and management of dyslipidemia.  She is a patient of Dr. Duke Salvia.  She was just seen about a week ago and had a LDL particle number and early March of 1221, LDL-C of 124, HDL-C of 57, triglycerides 197 and small LDL particle number of 344.  Lp(a) was also elevated at 101.3.  She has been intolerant to statins including atorvastatin, pravastatin and rosuvastatin in the past.  She had been started on Repatha.  It seemed that she was not taking the medicine consistently when her lipids were rechecked and plan was to repeat the lipids in about 6 months.  07/29/2022  [name] returns today for follow-up of dyslipidemia.  She reports that she has not been consistently taking Repatha due to concern about side effects.  She has not had recent lipids rechecked.  Her last labs were in April 2023 which showed a persistently elevated cholesterol 218, triglycerides 212, HDL 64 and LDL 118.  02/05/2023  [name] returns today for follow-up.  She had repeat lipid testing.  Surprisingly her LP(a) now is undetectable.  This is unusual even on Leqvio.  Her LDL particle number has come down to 1256 with an LDL-C of 126.  This response, however is not significant improvement compared to her labs in the past.  In fact, her cholesterol was similar to this about a year ago.  She may have been on Repatha at that time but did not tolerate it.  Her LDL particle number 6 months ago however was 1855 with an LDL-C of 168.  Based on these numbers, it appears she is only had about a 25% reduction in her cholesterol which is half of what is  expected.  She has already had 2 injections and has another Leqvio injection scheduled in May.  06/03/2023  [name] returns today for follow-up.  She has done well on Leqvio.  She has had additional lipid lowering now with LDL 107 and LDL particle number down to 1125.  LP(a) is come down to 108.5 from 134.  She had initially had an undetectable level last time however I think that was an error in the test.  We talked about her target LDL less than 70 and the fact that she may need additional treatment but she is quite hesitant to consider additional medications at this time.  PMHx:  Past Medical History:  Diagnosis Date   Colonoscopy causing post-procedural bleeding    Coronary artery calcification seen on computed tomography    Endometriosis    Essential hypertension 07/02/2021   Essential hypertension 07/02/2021   Hyperlipidemia    Murmur 08/12/2022   Myalgia 11/20/2016   Obesity 08/25/2016   Shortness of breath 07/21/2017   Thyroid disease    TIA (transient ischemic attack) 07/21/2017    Past Surgical History:  Procedure Laterality Date   CARDIAC CATHETERIZATION     CORONARY PRESSURE/FFR STUDY N/A 01/11/2020   Procedure: INTRAVASCULAR PRESSURE WIRE/FFR STUDY;  Surgeon: Corky Crafts, MD;  Location: [facility];  Service: Cardiovascular;  Laterality: N/A;   LEFT HEART CATH AND CORONARY ANGIOGRAPHY N/A 01/11/2020   Procedure:  LEFT HEART CATH AND CORONARY ANGIOGRAPHY;  Surgeon: Corky Crafts, MD;  Location: [facility];  Service: Cardiovascular;  Laterality: N/A;    FAMHx:  Family History  Problem Relation Age of Onset   CAD Mother    Dementia Mother    CAD Father    CAD Sister    Kidney disease Brother    Dementia Brother    Lymphoma Sister    Breast cancer Neg Hx     SOCHx:   reports that she has quit smoking. Her smoking use included cigarettes. She has never used smokeless tobacco. She reports current alcohol use. She reports that she does not use  drugs.  ALLERGIES:  Allergies  Allergen Reactions   Atorvastatin    Codeine    Demerol [Meperidine]    Penicillins Hives   Pravastatin    Repatha [Evolocumab]    Rosuvastatin     ROS: Pertinent items noted in HPI and remainder of comprehensive ROS otherwise negative.  HOME MEDS: Current Outpatient Medications on File Prior to Visit  Medication Sig Dispense Refill   amLODipine (NORVASC) 2.5 MG tablet Take 1 tablet (2.5 mg total) by mouth daily. (Patient taking differently: Take 5 mg by mouth daily.) 90 tablet 3   levothyroxine (SYNTHROID) 75 MCG tablet Take 75 mcg by mouth every morning.     sertraline (ZOLOFT) 25 MG tablet Take 25 mg by mouth daily.     No current facility-administered medications on file prior to visit.    LABS/IMAGING: No results found for this or any previous visit (from the past 48 hour(s)). No results found.  LIPID PANEL:    Component Value Date/Time   CHOL 267 (H) 08/03/2022 0948   TRIG 200 (H) 08/03/2022 0948   HDL 69 08/03/2022 0948   CHOLHDL 3.9 08/03/2022 0948   CHOLHDL 5.3 (H) 12/28/2016 0924   VLDL 49 (H) 12/28/2016 0924   LDLCALC 162 (H) 08/03/2022 0948    WEIGHTS: Wt Readings from Last 3 Encounters:  06/03/23 167 lb (75.8 kg)  04/06/23 169 lb 3.2 oz (76.7 kg)  02/05/23 169 lb 3.2 oz (76.7 kg)    VITALS: BP 124/82   Pulse 60   Ht 5' (1.524 m)   Wt 167 lb (75.8 kg)   SpO2 95%   BMI 32.61 kg/m   EXAM: Deferred  EKG: Deferred  ASSESSMENT: Mixed dyslipidemia, goal LDL less than 70 Elevated LP(a) at 101 Statin intolerance-myalgias History of TIA Hypertension Coronary artery calcification by CT  PLAN: 1.   [name] continues to have a significant reduction in her lipids but would benefit from driving the LDL to less than 70.  This may be achieved by adding Zetia but she is concerned about the possible GI side effects.  She would like to go another year on treatment with the Leqvio and see what her numbers look like  at that point.  Will plan follow-up at that time.  Chrystie Nose, MD, [facility], FACP  [place]  [facility]  Medical Director of the Advanced Lipid Disorders &  Cardiovascular Risk Reduction Clinic Diplomate of the American Board of Clinical Lipidology Attending Cardiologist  Direct Dial: [phone]  Fax: [phone]  Website:  [email or web address] 06/03/2023, 4:18 PM

## 2023-06-03 NOTE — Patient Instructions (Signed)
Medication Instructions:  NO CHANGES  *If you need a refill on your cardiac medications before your next appointment, please call your pharmacy*   Lab Work: FASTING labs in 1 year  If you have labs (blood work) drawn today and your tests are completely normal, you will receive your results only by: [place] (if you have MyChart) OR A paper copy in the mail If you have any lab test that is abnormal or we need to change your treatment, we will call you to review the results.   Follow-Up: At [facility], you and your health needs are our priority.  As part of our continuing mission to provide you with exceptional heart care, we have created designated Provider Care Teams.  These Care Teams include your primary Cardiologist (physician) and Advanced Practice Providers (APPs -  Physician Assistants and Nurse Practitioners) who all work together to provide you with the care you need, when you need it.  We recommend signing up for the patient portal called "MyChart".  Sign up information is provided on this After Visit Summary.  MyChart is used to connect with patients for Virtual Visits (Telemedicine).  Patients are able to view lab/test results, encounter notes, upcoming appointments, etc.  Non-urgent messages can be sent to your provider as well.   To learn more about what you can do with MyChart, go to [email or web address].    Your next appointment:    12 months with Dr. Debara Pickett

## 2023-08-03 NOTE — Telephone Encounter (Signed)
Medicare and Tricare coverage reviewed and auth extended to 11/16/23

## 2023-09-28 ENCOUNTER — Ambulatory Visit
Admission: RE | Admit: 2023-09-28 | Discharge: 2023-09-28 | Disposition: A | Discharge status: Home / Self Care | Payer: Medicare Other | Source: Ambulatory Visit | Attending: Physician Assistant | Admitting: Physician Assistant

## 2023-09-28 ENCOUNTER — Encounter: Payer: Self-pay | Admitting: Internal Medicine

## 2023-09-28 ENCOUNTER — Ambulatory Visit
Admission: RE | Admit: 2023-09-28 | Discharge: 2023-09-28 | Disposition: A | Discharge status: Home / Self Care | Payer: Medicare Other | Source: Ambulatory Visit | Attending: Physician Assistant

## 2023-09-28 DIAGNOSIS — N958 Other specified menopausal and perimenopausal disorders: Secondary | ICD-10-CM | POA: Diagnosis not present

## 2023-09-28 DIAGNOSIS — E2839 Other primary ovarian failure: Secondary | ICD-10-CM | POA: Diagnosis not present

## 2023-09-28 DIAGNOSIS — Z8739 Personal history of other diseases of the musculoskeletal system and connective tissue: Secondary | ICD-10-CM

## 2023-09-28 DIAGNOSIS — Z1231 Encounter for screening mammogram for malignant neoplasm of breast: Secondary | ICD-10-CM

## 2023-09-28 DIAGNOSIS — M8588 Other specified disorders of bone density and structure, other site: Secondary | ICD-10-CM | POA: Diagnosis not present

## 2023-10-08 ENCOUNTER — Ambulatory Visit: Payer: TRICARE For Life (TFL)

## 2023-10-13 ENCOUNTER — Ambulatory Visit: Payer: Medicare Other

## 2023-10-13 VITALS — BP 152/78 | HR 61 | Temp 98.3°F | Resp 16 | Ht 60.0 in | Wt 171.2 lb

## 2023-10-13 DIAGNOSIS — E782 Mixed hyperlipidemia: Secondary | ICD-10-CM

## 2023-10-13 DIAGNOSIS — G459 Transient cerebral ischemic attack, unspecified: Secondary | ICD-10-CM

## 2023-10-13 DIAGNOSIS — I2510 Atherosclerotic heart disease of native coronary artery without angina pectoris: Secondary | ICD-10-CM

## 2023-10-13 MED ORDER — INCLISIRAN SODIUM 284 MG/1.5ML ~~LOC~~ SOSY
284.0000 mg | PREFILLED_SYRINGE | Freq: Once | SUBCUTANEOUS | Status: AC
  Administered 2023-10-13: 284 mg via SUBCUTANEOUS
  Filled 2023-10-13: qty 1.5

## 2023-10-13 NOTE — Progress Notes (Signed)
Diagnosis: Hyperlipidemia  Provider:  Chilton Greathouse MD  Procedure: Injection  Leqvio (inclisiran), Dose: 284 mg, Site: subcutaneous, Number of injections: 1  Post Care:     Discharge: Condition: Good, Destination: Home . AVS Provided  Performed by:  Garnette Czech, RN

## 2023-10-18 DIAGNOSIS — I10 Essential (primary) hypertension: Secondary | ICD-10-CM | POA: Diagnosis not present

## 2023-10-18 DIAGNOSIS — M8588 Other specified disorders of bone density and structure, other site: Secondary | ICD-10-CM | POA: Diagnosis not present

## 2024-03-20 DIAGNOSIS — Z0000 Encounter for general adult medical examination without abnormal findings: Secondary | ICD-10-CM | POA: Diagnosis not present

## 2024-03-20 DIAGNOSIS — G4719 Other hypersomnia: Secondary | ICD-10-CM | POA: Diagnosis not present

## 2024-03-20 DIAGNOSIS — F4321 Adjustment disorder with depressed mood: Secondary | ICD-10-CM | POA: Diagnosis not present

## 2024-03-20 DIAGNOSIS — I10 Essential (primary) hypertension: Secondary | ICD-10-CM | POA: Diagnosis not present

## 2024-03-20 DIAGNOSIS — E039 Hypothyroidism, unspecified: Secondary | ICD-10-CM | POA: Diagnosis not present

## 2024-03-20 DIAGNOSIS — M8588 Other specified disorders of bone density and structure, other site: Secondary | ICD-10-CM | POA: Diagnosis not present

## 2024-03-20 DIAGNOSIS — M48061 Spinal stenosis, lumbar region without neurogenic claudication: Secondary | ICD-10-CM | POA: Diagnosis not present

## 2024-03-20 DIAGNOSIS — E7800 Pure hypercholesterolemia, unspecified: Secondary | ICD-10-CM | POA: Diagnosis not present

## 2024-03-20 DIAGNOSIS — R011 Cardiac murmur, unspecified: Secondary | ICD-10-CM | POA: Diagnosis not present

## 2024-03-20 DIAGNOSIS — Z23 Encounter for immunization: Secondary | ICD-10-CM | POA: Diagnosis not present

## 2024-03-20 DIAGNOSIS — I519 Heart disease, unspecified: Secondary | ICD-10-CM | POA: Diagnosis not present

## 2024-03-20 DIAGNOSIS — E559 Vitamin D deficiency, unspecified: Secondary | ICD-10-CM | POA: Diagnosis not present

## 2024-03-21 ENCOUNTER — Emergency Department (HOSPITAL_COMMUNITY)

## 2024-03-21 ENCOUNTER — Other Ambulatory Visit: Payer: Self-pay

## 2024-03-21 ENCOUNTER — Encounter: Payer: Self-pay | Admitting: Internal Medicine

## 2024-03-21 ENCOUNTER — Emergency Department (HOSPITAL_COMMUNITY)
Admission: EM | Admit: 2024-03-21 | Discharge: 2024-03-21 | Disposition: A | Discharge status: Home / Self Care | Attending: Emergency Medicine | Admitting: Emergency Medicine

## 2024-03-21 DIAGNOSIS — M542 Cervicalgia: Secondary | ICD-10-CM | POA: Diagnosis not present

## 2024-03-21 DIAGNOSIS — M47819 Spondylosis without myelopathy or radiculopathy, site unspecified: Secondary | ICD-10-CM | POA: Diagnosis not present

## 2024-03-21 DIAGNOSIS — M85811 Other specified disorders of bone density and structure, right shoulder: Secondary | ICD-10-CM | POA: Diagnosis not present

## 2024-03-21 DIAGNOSIS — R519 Headache, unspecified: Secondary | ICD-10-CM | POA: Insufficient documentation

## 2024-03-21 DIAGNOSIS — I10 Essential (primary) hypertension: Secondary | ICD-10-CM | POA: Diagnosis not present

## 2024-03-21 DIAGNOSIS — M79662 Pain in left lower leg: Secondary | ICD-10-CM | POA: Diagnosis not present

## 2024-03-21 DIAGNOSIS — M25511 Pain in right shoulder: Secondary | ICD-10-CM | POA: Diagnosis not present

## 2024-03-21 DIAGNOSIS — W108XXA Fall (on) (from) other stairs and steps, initial encounter: Secondary | ICD-10-CM | POA: Insufficient documentation

## 2024-03-21 DIAGNOSIS — R531 Weakness: Secondary | ICD-10-CM | POA: Diagnosis not present

## 2024-03-21 DIAGNOSIS — W19XXXA Unspecified fall, initial encounter: Secondary | ICD-10-CM | POA: Diagnosis not present

## 2024-03-21 DIAGNOSIS — M25512 Pain in left shoulder: Secondary | ICD-10-CM | POA: Diagnosis not present

## 2024-03-21 DIAGNOSIS — Y92019 Unspecified place in single-family (private) house as the place of occurrence of the external cause: Secondary | ICD-10-CM | POA: Insufficient documentation

## 2024-03-21 DIAGNOSIS — R4781 Slurred speech: Secondary | ICD-10-CM | POA: Diagnosis not present

## 2024-03-21 DIAGNOSIS — S0990XA Unspecified injury of head, initial encounter: Secondary | ICD-10-CM | POA: Diagnosis not present

## 2024-03-21 DIAGNOSIS — M1712 Unilateral primary osteoarthritis, left knee: Secondary | ICD-10-CM | POA: Diagnosis not present

## 2024-03-21 DIAGNOSIS — I2510 Atherosclerotic heart disease of native coronary artery without angina pectoris: Secondary | ICD-10-CM | POA: Insufficient documentation

## 2024-03-21 DIAGNOSIS — M79661 Pain in right lower leg: Secondary | ICD-10-CM | POA: Diagnosis not present

## 2024-03-21 DIAGNOSIS — M19011 Primary osteoarthritis, right shoulder: Secondary | ICD-10-CM | POA: Diagnosis not present

## 2024-03-21 DIAGNOSIS — G44309 Post-traumatic headache, unspecified, not intractable: Secondary | ICD-10-CM | POA: Diagnosis not present

## 2024-03-21 MED ORDER — LIDOCAINE 5 % EX PTCH
1.0000 | MEDICATED_PATCH | CUTANEOUS | Status: DC
  Administered 2024-03-21: 1 via TRANSDERMAL
  Filled 2024-03-21: qty 1

## 2024-03-21 MED ORDER — LIDOCAINE 5 % EX PTCH: 1.0000 | MEDICATED_PATCH | CUTANEOUS | 0 refills | Status: AC

## 2024-03-21 MED ORDER — ACETAMINOPHEN 325 MG PO TABS
650.0000 mg | ORAL_TABLET | Freq: Once | ORAL | Status: AC
  Administered 2024-03-21: 650 mg via ORAL
  Filled 2024-03-21: qty 2

## 2024-03-21 NOTE — ED Provider Notes (Signed)
 [place] EMERGENCY DEPARTMENT AT [facility] Provider Note   CSN: [id number] Arrival date & time: 03/21/24  1639     History  Chief Complaint  Patient presents with   [name]    [name] is a 78 y.o. female history of TIA, CAD, thyroid  disease, hyperlipidemia presented after mechanical fall.  Patient states she was walking back in the garage and missed a step going back into the house and landed forward on her face along with both of her shins and right shoulder.  Patient denies blood thinners, LOC.  Patient is overall since then.  Patient denies back pain states she has some neck discomfort as well but denies paresthesias or focal neurologic deficit    Home Medications Prior to Admission medications   Medication Sig Start Date End Date Taking? Authorizing Provider  lidocaine  (LIDODERM ) 5 % Place 1 patch onto the skin daily. Remove & Discard patch within 12 hours or as directed by MD 03/21/24  Yes Elison Worrel, Arlin Benes, PA-C  amLODipine  (NORVASC ) 2.5 MG tablet Take 1 tablet (2.5 mg total) by mouth daily. Patient taking differently: Take 5 mg by mouth daily. 02/05/22   Maudine Sos, MD  inclisiran (LEQVIO ) 284 MG/1.5ML SOSY injection Inject 284 mg into the skin as directed.    [provider]  levothyroxine (SYNTHROID) 75 MCG tablet Take 75 mcg by mouth every morning. 06/23/22   [provider]  sertraline (ZOLOFT) 25 MG tablet Take 25 mg by mouth daily.    [provider]      Allergies    Atorvastatin, Codeine, Demerol [meperidine], Penicillins, Pravastatin , Repatha  [evolocumab ], and Rosuvastatin    Review of Systems   Review of Systems  Physical Exam Updated Vital Signs BP (!) 166/76 (BP Location: Left Arm)   Pulse 64   Temp 98.7 F (37.1 C) (Oral)   Resp 18   SpO2 99%  Physical Exam Vitals reviewed.  Constitutional:      General: She is not in acute distress. HENT:     Head: Normocephalic and atraumatic.  Eyes:      Extraocular Movements: Extraocular movements intact.     Conjunctiva/sclera: Conjunctivae normal.     Pupils: Pupils are equal, round, and reactive to light.  Cardiovascular:     Rate and Rhythm: Normal rate and regular rhythm.     Pulses: Normal pulses.     Heart sounds: Normal heart sounds.     Comments: 2+ bilateral radial/dorsalis pedis pulses with regular rate Pulmonary:     Effort: Pulmonary effort is normal. No respiratory distress.     Breath sounds: Normal breath sounds.  Abdominal:     Palpations: Abdomen is soft.     Tenderness: There is no abdominal tenderness. There is no guarding or rebound.  Musculoskeletal:        General: Normal range of motion.     Cervical back: Normal range of motion and neck supple.     Comments: 5 out of 5 bilateral grip/leg extension strength  Skin:    General: Skin is warm and dry.     Capillary Refill: Capillary refill takes less than 2 seconds.     Comments: No signs of trauma to either shin or right shoulder  Neurological:     General: No focal deficit present.     Mental Status: She is alert and oriented to person, place, and time.     Sensory: Sensation is intact.     Motor: Motor function is intact.  Coordination: Coordination is intact.     Gait: Gait is intact.     Comments: Sensation intact in all 4 limbs Vision grossly intact Cranial nerves III through XII intact  Psychiatric:        Mood and Affect: Mood normal.     ED Results / Procedures / Treatments   Labs (all labs ordered are listed, but only abnormal results are displayed) Labs Reviewed - No data to display  EKG None  Radiology CT Head Wo Contrast Result Date: 03/21/2024 CLINICAL DATA:  Recent fall with headaches and neck pain, initial encounter EXAM: CT HEAD WITHOUT CONTRAST CT CERVICAL SPINE WITHOUT CONTRAST TECHNIQUE: Multidetector CT imaging of the head and cervical spine was performed following the standard protocol without intravenous contrast.  Multiplanar CT image reconstructions of the cervical spine were also generated. RADIATION DOSE REDUCTION: This exam was performed according to the departmental dose-optimization program which includes automated exposure control, adjustment of the mA and/or kV according to patient size and/or use of iterative reconstruction technique. COMPARISON:  None Available. FINDINGS: CT HEAD FINDINGS Brain: No evidence of acute infarction, hemorrhage, hydrocephalus, extra-axial collection or mass lesion/mass effect. Vascular: No hyperdense vessel or unexpected calcification. Skull: Normal. Negative for fracture or focal lesion. Sinuses/Orbits: No acute finding. Other: None. CT CERVICAL SPINE FINDINGS Alignment: Within normal limits. Skull base and vertebrae: 7 cervical segments are well visualized. Vertebral body height is well maintained. No acute fracture or acute facet abnormality is noted. Multilevel osteophytic changes are seen. Mild facet hypertrophic changes are noted as well. The odontoid is within normal limits. Soft tissues and spinal canal: Surrounding soft tissue structures are within normal limits. Upper chest: Visualized lung apices are unremarkable. Other: None IMPRESSION: CT of the head: No acute intracranial abnormality noted. CT of the cervical spine: Multilevel degenerative change without acute abnormality. Electronically Signed   By: Violeta Grey M.D.   On: 03/21/2024 19:20   CT Cervical Spine Wo Contrast Result Date: 03/21/2024 CLINICAL DATA:  Recent fall with headaches and neck pain, initial encounter EXAM: CT HEAD WITHOUT CONTRAST CT CERVICAL SPINE WITHOUT CONTRAST TECHNIQUE: Multidetector CT imaging of the head and cervical spine was performed following the standard protocol without intravenous contrast. Multiplanar CT image reconstructions of the cervical spine were also generated. RADIATION DOSE REDUCTION: This exam was performed according to the departmental dose-optimization program which includes  automated exposure control, adjustment of the mA and/or kV according to patient size and/or use of iterative reconstruction technique. COMPARISON:  None Available. FINDINGS: CT HEAD FINDINGS Brain: No evidence of acute infarction, hemorrhage, hydrocephalus, extra-axial collection or mass lesion/mass effect. Vascular: No hyperdense vessel or unexpected calcification. Skull: Normal. Negative for fracture or focal lesion. Sinuses/Orbits: No acute finding. Other: None. CT CERVICAL SPINE FINDINGS Alignment: Within normal limits. Skull base and vertebrae: 7 cervical segments are well visualized. Vertebral body height is well maintained. No acute fracture or acute facet abnormality is noted. Multilevel osteophytic changes are seen. Mild facet hypertrophic changes are noted as well. The odontoid is within normal limits. Soft tissues and spinal canal: Surrounding soft tissue structures are within normal limits. Upper chest: Visualized lung apices are unremarkable. Other: None IMPRESSION: CT of the head: No acute intracranial abnormality noted. CT of the cervical spine: Multilevel degenerative change without acute abnormality. Electronically Signed   By: Violeta Grey M.D.   On: 03/21/2024 19:20   DG Tibia/Fibula Left Result Date: 03/21/2024 CLINICAL DATA:  Fall, injury, pain EXAM: LEFT TIBIA AND FIBULA - 2 VIEW COMPARISON:  03/21/2024 FINDINGS: Intact left tibia and fibula. There is no evidence of fracture or other focal bone lesions. Left knee medial compartment mild degenerative osteoarthritis with bony spurring and joint space loss. Soft tissues are unremarkable. IMPRESSION: 1. No acute finding by plain radiography. 2. Left knee medial compartment degenerative osteoarthritis. Electronically Signed   By: Melven Stable.  Shick M.D.   On: 03/21/2024 18:00   DG Tibia/Fibula Right Result Date: 03/21/2024 CLINICAL DATA:  Fall, injury, lower leg pain EXAM: RIGHT TIBIA AND FIBULA - 2 VIEW COMPARISON:  None Available. FINDINGS: Intact  right tibia and fibula. Normal alignment. No joint abnormality. There is no evidence of fracture or other focal bone lesions. Soft tissues are unremarkable. IMPRESSION: No acute or significant finding by plain radiography Electronically Signed   By: Melven Stable.  Shick M.D.   On: 03/21/2024 17:56   DG Shoulder Right Result Date: 03/21/2024 CLINICAL DATA:  Fall, trauma, shoulder pain EXAM: RIGHT SHOULDER - 2+ VIEW COMPARISON:  None Available. FINDINGS: Bones are osteopenic. There is no evidence of fracture or dislocation. Normal alignment. Minor AC joint degenerative change without separation. Included right chest unremarkable. Degenerative changes noted of the spine. Soft tissues are unremarkable. IMPRESSION: Osteopenia and degenerative changes. No acute finding by plain radiography. Electronically Signed   By: Melven Stable.  Shick M.D.   On: 03/21/2024 17:51    Procedures Procedures    Medications Ordered in ED Medications  lidocaine  (LIDODERM ) 5 % 1 patch (1 patch Transdermal Patch Applied 03/21/24 1753)  acetaminophen  (TYLENOL ) tablet 650 mg (650 mg Oral Given 03/21/24 1754)    ED Course/ Medical Decision Making/ A&P                                 Medical Decision Making Amount and/or Complexity of Data Reviewed Radiology: ordered.  Risk OTC drugs. Prescription drug management.   [name] 78 y.o. presented today for fall. Working DDx that I considered at this time includes, but not limited to, vasovagal episode, mechanical fall, ICH, epidural/subdural hematoma, basilar skull fracture, anemia, electrolyte abnormalities, drug-induced, arrhythmia, UTI, fracture, contusion, soft tissue injury.  R/o DDx: vasovagal episode, ICH, epidural/subdural hematoma, basilar skull fracture, anemia, electrolyte abnormalities, drug-induced, arrhythmia, UTI, fracture: These are considered less likely due to history of present illness, physical exam, lab/imaging findings  Review of prior external notes: 03/21/2024  unknown  Unique Tests and My Independent Interpretation:  CT Head without contrast: Unremarkable CT Cervical spine without contrast: Unremarkable Right shoulder x-ray: Unremarkable Left tib-fib x-ray: Unremarkable Right tib-fib x-ray: Unremarkable  Social Determinants of Health: none  Discussion with Independent Historian:  Husband  Discussion of Management of Tests: None  Risk: Medium: prescription drug management  Risk Stratification Score: None  Plan: On exam patient was no acute distress with stable vitals.  On exam patient has no neurologic deficits and ultimately is reassuring exam.  Will get imaging of head and neck along with right shoulder and bilateral shins patient complains of aching pain there.  Will give Tylenol  and lidocaine  patches meantime as well.  Patient had mechanical fall does not require further workup at this time.  Imaging has been reassuring.  Will discharge on lidocaine  patches encourage use Tylenol  every 6 as needed for pain follow-up with her primary care provider.  Patient was given return precautions. Patient stable for discharge at this time.  Patient verbalized understanding of plan.  This chart was dictated using voice recognition software.  Despite  best efforts to proofread,  errors can occur which can change the documentation meaning.         Final Clinical Impression(s) / ED Diagnoses Final diagnoses:  Fall, initial encounter    Rx / DC Orders ED Discharge Orders          Ordered    lidocaine  (LIDODERM ) 5 %  Every 24 hours        03/21/24 1928              Elex Grimmer 03/21/24 2006    Kingsley, Ingalls K, DO 03/21/24 2012

## 2024-03-21 NOTE — Discharge Instructions (Signed)
 We saw you in the ER after you had a fall. All the imaging results are normal, no fractures seen. No evidence of brain bleed. Please be very careful with walking, and do everything possible to prevent falls. Please follow up with your primary care provider in the next week to be re-evaluated and ensure you are improving. If symptoms change or worsen, please return to ER.

## 2024-03-21 NOTE — ED Triage Notes (Signed)
 Patient from home via EMS after mechanical fall up stairs. She missed a stair and fell, striking her forehead on the ground as well as bilateral shins on the stairs. No LOC, not on blood thinner.

## 2024-04-04 ENCOUNTER — Other Ambulatory Visit: Payer: Self-pay | Admitting: *Deleted

## 2024-04-04 DIAGNOSIS — E785 Hyperlipidemia, unspecified: Secondary | ICD-10-CM

## 2024-04-11 ENCOUNTER — Ambulatory Visit: Payer: TRICARE For Life (TFL)

## 2024-04-17 ENCOUNTER — Ambulatory Visit (INDEPENDENT_AMBULATORY_CARE_PROVIDER_SITE_OTHER)

## 2024-04-17 ENCOUNTER — Encounter: Payer: Self-pay | Admitting: Internal Medicine

## 2024-04-17 ENCOUNTER — Telehealth: Payer: Self-pay

## 2024-04-17 VITALS — BP 158/76 | HR 59 | Temp 98.4°F | Resp 20 | Ht 60.0 in | Wt 172.0 lb

## 2024-04-17 DIAGNOSIS — I2510 Atherosclerotic heart disease of native coronary artery without angina pectoris: Secondary | ICD-10-CM | POA: Diagnosis not present

## 2024-04-17 DIAGNOSIS — G459 Transient cerebral ischemic attack, unspecified: Secondary | ICD-10-CM

## 2024-04-17 DIAGNOSIS — E782 Mixed hyperlipidemia: Secondary | ICD-10-CM | POA: Diagnosis not present

## 2024-04-17 MED ORDER — INCLISIRAN SODIUM 284 MG/1.5ML ~~LOC~~ SOSY
284.0000 mg | PREFILLED_SYRINGE | Freq: Once | SUBCUTANEOUS | Status: AC
  Administered 2024-04-17: 284 mg via SUBCUTANEOUS
  Filled 2024-04-17: qty 1.5

## 2024-04-17 NOTE — Telephone Encounter (Signed)
 Auth Submission: NO AUTH NEEDED Site of care: Site of care: [facility] Payer: Medicare A/B only Medication & CPT/J Code(s) submitted: Leqvio  (Inclisiran) J1306 Route of submission (phone, fax, portal):  Phone # Fax # Auth type: Buy/Bill PB Units/visits requested: 284mg  x 2 doses Reference number:  Approval from: 04/17/24 to 12/16/24   Medicare A/B will cover 80%, patient will be responsible for the remaining 20%.

## 2024-04-17 NOTE — Progress Notes (Signed)
 Diagnosis: Hyperlipidemia  Provider:  Chilton Greathouse MD  Procedure: Injection  Leqvio (inclisiran), Dose: 284 mg, Site: subcutaneous, Number of injections: 1  Injection Site(s): Right arm  Discharge: Condition: Good, Destination: Home . AVS Provided  Performed by:  Nat Math, RN

## 2024-05-01 DIAGNOSIS — E785 Hyperlipidemia, unspecified: Secondary | ICD-10-CM | POA: Diagnosis not present

## 2024-05-01 DIAGNOSIS — I10 Essential (primary) hypertension: Secondary | ICD-10-CM | POA: Diagnosis not present

## 2024-05-01 DIAGNOSIS — E039 Hypothyroidism, unspecified: Secondary | ICD-10-CM | POA: Diagnosis not present

## 2024-05-14 ENCOUNTER — Encounter: Payer: Self-pay | Admitting: Internal Medicine

## 2024-05-14 ENCOUNTER — Emergency Department (HOSPITAL_COMMUNITY)

## 2024-05-14 ENCOUNTER — Emergency Department (HOSPITAL_COMMUNITY)
Admission: EM | Admit: 2024-05-14 | Discharge: 2024-05-14 | Disposition: A | Discharge status: Home / Self Care | Attending: Emergency Medicine | Admitting: Emergency Medicine

## 2024-05-14 ENCOUNTER — Other Ambulatory Visit: Payer: Self-pay

## 2024-05-14 ENCOUNTER — Encounter (HOSPITAL_COMMUNITY): Payer: Self-pay | Admitting: Emergency Medicine

## 2024-05-14 DIAGNOSIS — R072 Precordial pain: Secondary | ICD-10-CM | POA: Diagnosis present

## 2024-05-14 DIAGNOSIS — R0789 Other chest pain: Secondary | ICD-10-CM | POA: Insufficient documentation

## 2024-05-14 DIAGNOSIS — M79601 Pain in right arm: Secondary | ICD-10-CM | POA: Diagnosis not present

## 2024-05-14 DIAGNOSIS — I2510 Atherosclerotic heart disease of native coronary artery without angina pectoris: Secondary | ICD-10-CM | POA: Diagnosis not present

## 2024-05-14 DIAGNOSIS — R079 Chest pain, unspecified: Secondary | ICD-10-CM | POA: Diagnosis not present

## 2024-05-14 DIAGNOSIS — K429 Umbilical hernia without obstruction or gangrene: Secondary | ICD-10-CM | POA: Diagnosis not present

## 2024-05-14 DIAGNOSIS — K5730 Diverticulosis of large intestine without perforation or abscess without bleeding: Secondary | ICD-10-CM | POA: Diagnosis not present

## 2024-05-14 LAB — BASIC METABOLIC PANEL WITH GFR
Anion gap: 9 (ref 5–15)
BUN: 21 mg/dL (ref 8–23)
CO2: 25 mmol/L (ref 22–32)
Calcium: 9.9 mg/dL (ref 8.9–10.3)
Chloride: 104 mmol/L (ref 98–111)
Creatinine, Ser: 1.16 mg/dL — ABNORMAL HIGH (ref 0.44–1.00)
GFR, Estimated: 48 mL/min — ABNORMAL LOW (ref >60)
Glucose, Bld: 95 mg/dL (ref 70–99)
Potassium: 4.2 mmol/L (ref 3.5–5.1)
Sodium: 138 mmol/L (ref 135–145)

## 2024-05-14 LAB — CBC
HCT: 36.7 % (ref 36.0–46.0)
Hemoglobin: 12.2 g/dL (ref 12.0–15.0)
MCH: 31.1 pg (ref 26.0–34.0)
MCHC: 33.2 g/dL (ref 30.0–36.0)
MCV: 93.6 fL (ref 80.0–100.0)
Platelets: 250 10*3/uL (ref 150–400)
RBC: 3.92 MIL/uL (ref 3.87–5.11)
RDW: 13.2 % (ref 11.5–15.5)
WBC: 5.7 10*3/uL (ref 4.0–10.5)
nRBC: 0 % (ref 0.0–0.2)

## 2024-05-14 LAB — TROPONIN I (HIGH SENSITIVITY)
Troponin I (High Sensitivity): 3 ng/L (ref <18)
Troponin I (High Sensitivity): 3 ng/L (ref <18)

## 2024-05-14 LAB — D-DIMER, QUANTITATIVE: D-Dimer, Quant: 0.63 ug{FEU}/mL — ABNORMAL HIGH (ref 0.00–0.50)

## 2024-05-14 MED ORDER — SODIUM CHLORIDE 0.9 % IV BOLUS
1000.0000 mL | Freq: Once | INTRAVENOUS | Status: AC
  Administered 2024-05-14: 1000 mL via INTRAVENOUS

## 2024-05-14 MED ORDER — ASPIRIN 81 MG PO CHEW
324.0000 mg | CHEWABLE_TABLET | Freq: Once | ORAL | Status: AC
  Administered 2024-05-14: 324 mg via ORAL
  Filled 2024-05-14: qty 4

## 2024-05-14 MED ORDER — METHYLPREDNISOLONE 4 MG PO TBPK: ORAL_TABLET | ORAL | 0 refills | Status: AC

## 2024-05-14 MED ORDER — IOHEXOL 350 MG/ML SOLN
75.0000 mL | Freq: Once | INTRAVENOUS | Status: AC | PRN
  Administered 2024-05-14: 75 mL via INTRAVENOUS

## 2024-05-14 NOTE — ED Provider Notes (Signed)
 [place] EMERGENCY DEPARTMENT AT [facility] Provider Note   CSN: [id number] Arrival date & time: 05/14/24  1444     Patient presents with: Chest Pain   [name] is a 78 y.o. female.   HPI Patient presents with chest pain.  Onset was 2 hours prior to my evaluation pain is sternal, right parasternal.  Yesterday patient had right sided arm pain, this seemingly has improved.  No left arm pain, no headache, no weakness in any extremity.  Pain does radiate to the back. She notes 2 prior catheterizations, no prior interventions.    Prior to Admission medications   Medication Sig Start Date End Date Taking? Authorizing Provider  methylPREDNISolone  (MEDROL  DOSEPAK) 4 MG TBPK tablet Use as directed on the packaging. 05/14/24  Yes Garrick Charleston, MD  amLODipine  (NORVASC ) 2.5 MG tablet Take 1 tablet (2.5 mg total) by mouth daily. Patient taking differently: Take 5 mg by mouth daily. 02/05/22   Raford Riggs, MD  inclisiran (LEQVIO ) 284 MG/1.5ML SOSY injection Inject 284 mg into the skin as directed.    [provider]  levothyroxine (SYNTHROID) 75 MCG tablet Take 75 mcg by mouth every morning. 06/23/22   [provider]  lidocaine  (LIDODERM ) 5 % Place 1 patch onto the skin daily. Remove & Discard patch within 12 hours or as directed by MD 03/21/24   Victor Lynwood DASEN, PA-C  sertraline (ZOLOFT) 25 MG tablet Take 25 mg by mouth daily.    [provider]    Allergies: Atorvastatin, Codeine, Demerol [meperidine], Penicillins, Pravastatin , Repatha  Amos.Backer ], and Rosuvastatin    Review of Systems  Updated Vital Signs BP 134/88   Pulse 63   Temp 98 F (36.7 C) (Oral)   Resp 16   Ht 5' (1.524 m)   Wt 78 kg   SpO2 100%   BMI 33.59 kg/m   Physical Exam Vitals and nursing note reviewed.  Constitutional:      General: She is not in acute distress.    Appearance: She is well-developed.  HENT:     Head: Normocephalic and atraumatic.    Eyes:     Conjunctiva/sclera: Conjunctivae normal.    Cardiovascular:     Rate and Rhythm: Normal rate and regular rhythm.  Pulmonary:     Effort: Pulmonary effort is normal. No respiratory distress.     Breath sounds: Normal breath sounds. No stridor.  Abdominal:     General: There is no distension.   Skin:    General: Skin is warm and dry.   Neurological:     Mental Status: She is alert and oriented to person, place, and time.     Cranial Nerves: No cranial nerve deficit.   Psychiatric:        Mood and Affect: Mood normal.     (all labs ordered are listed, but only abnormal results are displayed) Labs Reviewed  BASIC METABOLIC PANEL WITH GFR - Abnormal; Notable for the following components:      Result Value   Creatinine, Ser 1.16 (*)    GFR, Estimated 48 (*)    All other components within normal limits  D-DIMER, QUANTITATIVE - Abnormal; Notable for the following components:   D-Dimer, Quant 0.63 (*)    All other components within normal limits  CBC  TROPONIN I (HIGH SENSITIVITY)  TROPONIN I (HIGH SENSITIVITY)    EKG: EKG Interpretation Date/Time:  Sunday May 14 2024 15:02:53 EDT Ventricular Rate:  60 PR Interval:  168 QRS Duration:  68 QT Interval:  400 QTC Calculation: 400 R Axis:   35  Text Interpretation: Normal sinus rhythm Low voltage QRS Cannot rule out Anterior infarct , age undetermined Abnormal ECG Confirmed by Garrick Charleston [phone]) on 05/14/2024 4:16:15 PM  Radiology: CT Angio Chest/Abd/Pel for Dissection W and/or Wo Contrast Result Date: 05/14/2024 CLINICAL DATA:  Provided history: Acute aortic syndrome (AAS) suspected Chest pain. EXAM: CT ANGIOGRAPHY CHEST, ABDOMEN AND PELVIS TECHNIQUE: Non-contrast CT of the chest was initially obtained. Multidetector CT imaging through the chest, abdomen and pelvis was performed using the standard protocol during bolus administration of intravenous contrast. Multiplanar reconstructed images and MIPs were  obtained and reviewed to evaluate the vascular anatomy. RADIATION DOSE REDUCTION: This exam was performed according to the departmental dose-optimization program which includes automated exposure control, adjustment of the mA and/or kV according to patient size and/or use of iterative reconstruction technique. CONTRAST:  75mL OMNIPAQUE  IOHEXOL  350 MG/ML SOLN COMPARISON:  Radiograph earlier today FINDINGS: CTA CHEST FINDINGS Cardiovascular: No aortic hematoma on unenhanced exam. Mild aortic atherosclerosis. No aneurysm, dissection, or acute aortic finding. There is no central pulmonary embolus. The heart is normal in size. Scattered coronary artery calcifications. No pericardial effusion. Mediastinum/Nodes: No enlarged mediastinal or hilar lymph nodes. Tiny hiatal hernia. Tiny right thyroid  calcification is no further imaging follow-up. Lungs/Pleura: No focal airspace disease. No pleural effusion. No features of pulmonary edema. Subsegmental atelectasis in the dependent lungs. Trachea and central airways are clear. Musculoskeletal: Thoracic spondylosis. There are no acute or suspicious osseous abnormalities. Review of the MIP images confirms the above findings. CTA ABDOMEN AND PELVIS FINDINGS VASCULAR Aorta: Normal caliber aorta without aneurysm, dissection, vasculitis or significant stenosis. Aortic atherosclerosis. Celiac: Patent without evidence of aneurysm, dissection, vasculitis or significant stenosis. SMA: Patent without evidence of aneurysm, dissection, vasculitis or significant stenosis. Renals: 2 renal arteries bilaterally. All renal arteries are patent without evidence of aneurysm, dissection, vasculitis, fibromuscular dysplasia or significant stenosis. IMA: Patent without evidence of aneurysm, dissection, vasculitis or significant stenosis. Inflow: Patent without evidence of aneurysm, dissection, vasculitis or significant stenosis. Veins: No obvious venous abnormality within the limitations of this  arterial phase study. Review of the MIP images confirms the above findings. NON-VASCULAR Hepatobiliary: Diffuse hepatic steatosis. No focal liver abnormality on this arterial phase exam. Gallbladder physiologically distended, no calcified stone. No biliary dilatation. Pancreas: No ductal dilatation or inflammation. Spleen: Normal in size and arterial enhancement. Adrenals/Urinary Tract: Normal adrenal glands. Prominence of the right renal collecting system and ureter. No cause for obstruction. No renal or ureteral calculi. Homogeneous enhancement. Partially distended urinary bladder, normal for degree of distension. Stomach/Bowel: Minimal hiatal hernia. Minimal sigmoid diverticulosis. No diverticulitis. Moderate volume of stool throughout the colon. There is no bowel obstruction or inflammatory change. Diminutive appendix is normal. Lymphatic: No enlarged lymph nodes in the abdomen or pelvis. Reproductive: Uterus and bilateral adnexa are unremarkable. Other: No free air or ascites. Minimal fat containing umbilical hernia. Musculoskeletal: Hemi transitional lumbosacral anatomy. There are no acute or suspicious osseous abnormalities. Review of the MIP images confirms the above findings. IMPRESSION: 1. No aortic dissection or acute aortic abnormality. 2. Prominence of the right renal collecting system and ureter, but no evidence of renal inflammation or obstructive changes. This is of unknown clinical significance. 3. Hepatic steatosis. 4. Minimal sigmoid diverticulosis without diverticulitis. Aortic Atherosclerosis (ICD10-I70.0). Electronically Signed   By: Andrea Gasman M.D.   On: 05/14/2024 19:54   DG Chest Port 1 View Result Date: 05/14/2024 CLINICAL DATA:  cp  EXAM: PORTABLE CHEST - 1 VIEW COMPARISON:  10/06/2008 FINDINGS: Lungs are clear.  No pneumothorax. Heart size and mediastinal contours are within normal limits. Aortic Atherosclerosis (ICD10-170.0). No effusion. Visualized bones unremarkable.  IMPRESSION: No acute cardiopulmonary disease. Electronically Signed   By: JONETTA Faes M.D.   On: 05/14/2024 16:31     Procedures   Medications Ordered in the ED  aspirin  chewable tablet 324 mg (324 mg Oral Given 05/14/24 1802)  sodium chloride  0.9 % bolus 1,000 mL (0 mLs Intravenous Stopped 05/14/24 1917)  iohexol  (OMNIPAQUE ) 350 MG/ML injection 75 mL (75 mLs Intravenous Contrast Given 05/14/24 1918)                                    Medical Decision Making Patient with a history of hypertension, now presents with chest pain.  Given her history, ACS, gastroesophagitis, pancreatitis, hepatobiliary disease, or dissection all considered.  Cardiac 60 sinus normal pulse ox 98% room air normal.  Amount and/or Complexity of Data Reviewed Independent Historian: spouse External Data Reviewed: notes. Labs: ordered. Decision-making details documented in ED Course. Radiology: ordered and independent interpretation performed. Decision-making details documented in ED Course. ECG/medicine tests: ordered and independent interpretation performed. Decision-making details documented in ED Course.  Risk OTC drugs. Prescription drug management.  Update: Patient and I discussed her chest pain again on repeat exam, vitals remained stable, initial labs reassuring, troponin normal, D-dimer positive, though age-adjusted negative.  She describes pain that is more severe intermittently, with persistent hypertension, CTA ordered.   Update:, Now on return of second troponin which is normal, she states that she had a fall less than 2 weeks ago fell striking the anterior thorax during the fall, there are some consideration inflammation, CTA is occurred results not available 8:14 PM CTA results available, no aortic disruption, no pneumonia, she remains awake and alert, now smiling, improved.  We discussed all findings, no evidence for ACS, PE, aortic disruption, patient and I discussed admission for chest pain versus  close outpatient follow-up and the patient will follow this latter course.  Patient discharged in stable condition.     Final diagnoses:  Atypical chest pain    ED Discharge Orders          Ordered    methylPREDNISolone  (MEDROL  DOSEPAK) 4 MG TBPK tablet        05/14/24 2014               Garrick Charleston, MD 05/14/24 2014

## 2024-05-14 NOTE — ED Triage Notes (Signed)
 Patient arrives in wheelchair by POV c/o central chest pain onset around 12:30. Pain radiates into under arms and back. States her legs became very rubbery and she laid down on the couch. States she then began having difficulty catching her breath. Took 4 baby aspirin  PTA.

## 2024-05-14 NOTE — ED Notes (Signed)
 Reviewed D/C information with the patient, pt verbalized understanding. No additional concerns at this time.

## 2024-05-14 NOTE — ED Provider Triage Note (Signed)
 Emergency Medicine Provider Triage Evaluation Note  [name] , a 78 y.o. female  was evaluated in triage.  Pt complains of under her right armpit, her left armpit tightness radiating through her back began around 2 hours ago.  Did take 4 baby aspirin  which did help with her symptoms.  Prior cardiac history with 2 stents.  Also endorsing that she cannot catch my breath.  No exacerbating factors.  No other complaints reported.  Review of Systems  Positive: Chest pain, sob Negative: cough  Physical Exam  There were no vitals taken for this visit. Gen:   Awake, no distress   Resp:  Normal effort  MSK:   Moves extremities without difficulty  Other:    Medical Decision Making  Medically screening exam initiated at 2:56 PM.  Appropriate orders placed.  [name] was informed that the remainder of the evaluation will be completed by another provider, this initial triage assessment does not replace that evaluation, and the importance of remaining in the ED until their evaluation is complete.  ECHO normal 2024   [name] Broad, PA-C 05/14/24 1501

## 2024-05-14 NOTE — ED Notes (Signed)
 Patient transported to CT

## 2024-05-14 NOTE — Discharge Instructions (Signed)
 Be sure to follow-up with your physician via telephone tomorrow.  Return here for concerning changes in your condition.

## 2024-05-15 ENCOUNTER — Encounter: Payer: Self-pay | Admitting: Internal Medicine

## 2024-09-07 DIAGNOSIS — H31003 Unspecified chorioretinal scars, bilateral: Secondary | ICD-10-CM | POA: Diagnosis not present

## 2024-09-07 DIAGNOSIS — H25011 Cortical age-related cataract, right eye: Secondary | ICD-10-CM | POA: Diagnosis not present

## 2024-09-07 DIAGNOSIS — H25041 Posterior subcapsular polar age-related cataract, right eye: Secondary | ICD-10-CM | POA: Diagnosis not present

## 2024-09-07 DIAGNOSIS — H2511 Age-related nuclear cataract, right eye: Secondary | ICD-10-CM | POA: Diagnosis not present

## 2024-09-07 DIAGNOSIS — H52203 Unspecified astigmatism, bilateral: Secondary | ICD-10-CM | POA: Diagnosis not present

## 2024-09-07 DIAGNOSIS — H04123 Dry eye syndrome of bilateral lacrimal glands: Secondary | ICD-10-CM | POA: Diagnosis not present

## 2024-09-07 DIAGNOSIS — H524 Presbyopia: Secondary | ICD-10-CM | POA: Diagnosis not present

## 2024-10-16 DIAGNOSIS — Z961 Presence of intraocular lens: Secondary | ICD-10-CM | POA: Diagnosis not present

## 2024-10-16 DIAGNOSIS — H25041 Posterior subcapsular polar age-related cataract, right eye: Secondary | ICD-10-CM | POA: Diagnosis not present

## 2024-10-16 DIAGNOSIS — H2511 Age-related nuclear cataract, right eye: Secondary | ICD-10-CM | POA: Diagnosis not present

## 2024-10-16 DIAGNOSIS — I10 Essential (primary) hypertension: Secondary | ICD-10-CM | POA: Diagnosis not present

## 2024-10-16 DIAGNOSIS — H25011 Cortical age-related cataract, right eye: Secondary | ICD-10-CM | POA: Diagnosis not present

## 2024-10-16 DIAGNOSIS — H25811 Combined forms of age-related cataract, right eye: Secondary | ICD-10-CM | POA: Diagnosis not present

## 2024-10-16 DIAGNOSIS — E7849 Other hyperlipidemia: Secondary | ICD-10-CM | POA: Diagnosis not present

## 2024-10-17 ENCOUNTER — Ambulatory Visit

## 2024-10-20 ENCOUNTER — Ambulatory Visit

## 2024-10-26 ENCOUNTER — Ambulatory Visit

## 2024-10-26 VITALS — BP 146/76 | HR 64 | Temp 97.9°F | Resp 16 | Ht 60.0 in | Wt 169.4 lb

## 2024-10-26 DIAGNOSIS — E782 Mixed hyperlipidemia: Secondary | ICD-10-CM

## 2024-10-26 DIAGNOSIS — G459 Transient cerebral ischemic attack, unspecified: Secondary | ICD-10-CM

## 2024-10-26 DIAGNOSIS — I2510 Atherosclerotic heart disease of native coronary artery without angina pectoris: Secondary | ICD-10-CM

## 2024-10-26 MED ORDER — INCLISIRAN SODIUM 284 MG/1.5ML ~~LOC~~ SOSY
284.0000 mg | PREFILLED_SYRINGE | Freq: Once | SUBCUTANEOUS | Status: AC
  Administered 2024-10-26: 284 mg via SUBCUTANEOUS
  Filled 2024-10-26: qty 1.5

## 2024-10-26 NOTE — Progress Notes (Signed)
 Diagnosis: Hyperlipidemia  Provider:  Chilton Greathouse MD  Procedure: Injection  Leqvio (inclisiran), Dose: 284 mg, Site: subcutaneous, Number of injections: 1  Injection Site(s): Right arm  Post Care:  right arm injection  Discharge: Condition: Good, Destination: Home . AVS Provided  Performed by:  Rico Ala, LPN

## 2024-11-24 ENCOUNTER — Telehealth: Payer: Self-pay

## 2024-11-24 NOTE — Telephone Encounter (Signed)
 Auth Submission: NO AUTH NEEDED Site of care: Site of care: [facility] Payer: Medicare A/B with Tricare for life Medication & CPT/J Code(s) submitted: Leqvio  (Inclisiran) J1306 Diagnosis Code:  Route of submission (phone, fax, portal):  Phone # Fax # Auth type: Buy/Bill PB Units/visits requested: 284mg  x 2 doses Reference number:  Approval from: 11/24/24 to 12/16/25

## 2025-04-26 ENCOUNTER — Ambulatory Visit
